# Patient Record
Sex: Female | Born: 1970 | Race: White | Hispanic: No | Marital: Married | Smoking: Former smoker
Health system: Southern US, Community
[De-identification: ages and names within clinical notes are randomized; demographics above are authoritative.]

## PROBLEM LIST (undated history)

## (undated) DIAGNOSIS — E079 Disorder of thyroid, unspecified: Secondary | ICD-10-CM

## (undated) DIAGNOSIS — F419 Anxiety disorder, unspecified: Secondary | ICD-10-CM

## (undated) DIAGNOSIS — K219 Gastro-esophageal reflux disease without esophagitis: Secondary | ICD-10-CM

## (undated) HISTORY — DX: Anxiety disorder, unspecified: F41.9

## (undated) HISTORY — PX: COSMETIC SURGERY: SHX468

## (undated) HISTORY — DX: Gastro-esophageal reflux disease without esophagitis: K21.9

## (undated) HISTORY — PX: EYE SURGERY: SHX253

## (undated) HISTORY — PX: ABDOMINAL HYSTERECTOMY: SHX81

---

## 1999-08-04 ENCOUNTER — Inpatient Hospital Stay (HOSPITAL_COMMUNITY): Admission: AD | Admit: 1999-08-04 | Discharge: 1999-08-04 | Payer: Self-pay | Admitting: Obstetrics and Gynecology

## 1999-08-09 ENCOUNTER — Inpatient Hospital Stay (HOSPITAL_COMMUNITY): Admission: AD | Admit: 1999-08-09 | Discharge: 1999-08-09 | Payer: Self-pay | Admitting: Obstetrics and Gynecology

## 1999-08-14 ENCOUNTER — Encounter (INDEPENDENT_AMBULATORY_CARE_PROVIDER_SITE_OTHER): Payer: Self-pay | Admitting: Specialist

## 1999-08-14 ENCOUNTER — Inpatient Hospital Stay (HOSPITAL_COMMUNITY): Admission: AD | Admit: 1999-08-14 | Discharge: 1999-08-17 | Payer: Self-pay | Admitting: Obstetrics and Gynecology

## 1999-09-16 ENCOUNTER — Other Ambulatory Visit: Admission: RE | Admit: 1999-09-16 | Discharge: 1999-09-16 | Payer: Self-pay | Admitting: Obstetrics and Gynecology

## 2000-09-26 ENCOUNTER — Other Ambulatory Visit: Admission: RE | Admit: 2000-09-26 | Discharge: 2000-09-26 | Payer: Self-pay | Admitting: Obstetrics and Gynecology

## 2000-12-23 ENCOUNTER — Other Ambulatory Visit: Admission: RE | Admit: 2000-12-23 | Discharge: 2000-12-23 | Payer: Self-pay | Admitting: Obstetrics and Gynecology

## 2001-09-07 ENCOUNTER — Other Ambulatory Visit: Admission: RE | Admit: 2001-09-07 | Discharge: 2001-09-07 | Payer: Self-pay | Admitting: Obstetrics and Gynecology

## 2001-12-27 ENCOUNTER — Inpatient Hospital Stay (HOSPITAL_COMMUNITY): Admission: AD | Admit: 2001-12-27 | Discharge: 2001-12-27 | Payer: Self-pay | Admitting: Obstetrics and Gynecology

## 2002-01-02 ENCOUNTER — Ambulatory Visit (HOSPITAL_COMMUNITY): Admission: RE | Admit: 2002-01-02 | Discharge: 2002-01-02 | Payer: Self-pay | Admitting: Obstetrics and Gynecology

## 2002-01-02 ENCOUNTER — Encounter (INDEPENDENT_AMBULATORY_CARE_PROVIDER_SITE_OTHER): Payer: Self-pay | Admitting: Specialist

## 2002-05-10 ENCOUNTER — Encounter: Payer: Self-pay | Admitting: Obstetrics and Gynecology

## 2002-05-10 ENCOUNTER — Encounter: Admission: RE | Admit: 2002-05-10 | Discharge: 2002-05-10 | Payer: Self-pay | Admitting: Obstetrics and Gynecology

## 2002-09-13 ENCOUNTER — Other Ambulatory Visit: Admission: RE | Admit: 2002-09-13 | Discharge: 2002-09-13 | Payer: Self-pay | Admitting: Obstetrics and Gynecology

## 2003-01-31 ENCOUNTER — Other Ambulatory Visit: Admission: RE | Admit: 2003-01-31 | Discharge: 2003-01-31 | Payer: Self-pay | Admitting: Obstetrics and Gynecology

## 2003-03-01 ENCOUNTER — Encounter (INDEPENDENT_AMBULATORY_CARE_PROVIDER_SITE_OTHER): Payer: Self-pay

## 2003-03-01 ENCOUNTER — Ambulatory Visit (HOSPITAL_COMMUNITY): Admission: RE | Admit: 2003-03-01 | Discharge: 2003-03-01 | Payer: Self-pay | Admitting: Obstetrics and Gynecology

## 2003-05-22 ENCOUNTER — Ambulatory Visit (HOSPITAL_COMMUNITY): Admission: RE | Admit: 2003-05-22 | Discharge: 2003-05-22 | Payer: Self-pay | Admitting: Neurology

## 2003-05-31 ENCOUNTER — Ambulatory Visit (HOSPITAL_COMMUNITY): Admission: RE | Admit: 2003-05-31 | Discharge: 2003-05-31 | Payer: Self-pay | Admitting: Neurology

## 2003-05-31 ENCOUNTER — Encounter (INDEPENDENT_AMBULATORY_CARE_PROVIDER_SITE_OTHER): Payer: Self-pay | Admitting: Specialist

## 2003-06-03 ENCOUNTER — Ambulatory Visit (HOSPITAL_COMMUNITY): Admission: RE | Admit: 2003-06-03 | Discharge: 2003-06-03 | Payer: Self-pay | Admitting: Neurology

## 2003-06-25 ENCOUNTER — Other Ambulatory Visit: Admission: RE | Admit: 2003-06-25 | Discharge: 2003-06-25 | Payer: Self-pay | Admitting: Obstetrics and Gynecology

## 2003-08-27 ENCOUNTER — Ambulatory Visit (HOSPITAL_BASED_OUTPATIENT_CLINIC_OR_DEPARTMENT_OTHER): Admission: RE | Admit: 2003-08-27 | Discharge: 2003-08-27 | Payer: Self-pay | Admitting: Plastic Surgery

## 2003-08-27 ENCOUNTER — Ambulatory Visit (HOSPITAL_COMMUNITY): Admission: RE | Admit: 2003-08-27 | Discharge: 2003-08-27 | Payer: Self-pay | Admitting: Plastic Surgery

## 2003-08-27 ENCOUNTER — Encounter (INDEPENDENT_AMBULATORY_CARE_PROVIDER_SITE_OTHER): Payer: Self-pay | Admitting: *Deleted

## 2003-09-13 ENCOUNTER — Other Ambulatory Visit: Admission: RE | Admit: 2003-09-13 | Discharge: 2003-09-13 | Payer: Self-pay | Admitting: Obstetrics and Gynecology

## 2003-12-12 ENCOUNTER — Other Ambulatory Visit: Admission: RE | Admit: 2003-12-12 | Discharge: 2003-12-12 | Payer: Self-pay | Admitting: Obstetrics and Gynecology

## 2004-07-07 ENCOUNTER — Ambulatory Visit (HOSPITAL_COMMUNITY): Admission: RE | Admit: 2004-07-07 | Discharge: 2004-07-07 | Payer: Self-pay | Admitting: Obstetrics and Gynecology

## 2004-07-28 ENCOUNTER — Other Ambulatory Visit: Admission: RE | Admit: 2004-07-28 | Discharge: 2004-07-28 | Payer: Self-pay | Admitting: Obstetrics and Gynecology

## 2004-11-11 ENCOUNTER — Ambulatory Visit (HOSPITAL_COMMUNITY): Admission: RE | Admit: 2004-11-11 | Discharge: 2004-11-11 | Payer: Self-pay | Admitting: Neurology

## 2005-07-22 ENCOUNTER — Emergency Department (HOSPITAL_COMMUNITY): Admission: EM | Admit: 2005-07-22 | Discharge: 2005-07-22 | Payer: Self-pay | Admitting: Family Medicine

## 2005-07-28 ENCOUNTER — Ambulatory Visit (HOSPITAL_COMMUNITY): Admission: RE | Admit: 2005-07-28 | Discharge: 2005-07-28 | Payer: Self-pay | Admitting: Neurology

## 2006-08-16 ENCOUNTER — Encounter: Admission: RE | Admit: 2006-08-16 | Discharge: 2006-08-16 | Payer: Self-pay | Admitting: Orthopedic Surgery

## 2006-08-25 ENCOUNTER — Ambulatory Visit (HOSPITAL_BASED_OUTPATIENT_CLINIC_OR_DEPARTMENT_OTHER): Admission: RE | Admit: 2006-08-25 | Discharge: 2006-08-25 | Payer: Self-pay | Admitting: Orthopedic Surgery

## 2007-04-13 IMAGING — US US ABDOMEN COMPLETE
1 series · 14 of 25 positions shown · non-contrast
Comparison: none

CLINICAL DATA: Right upper quadrant pain. 
 ABDOMINAL ULTRASOUND:
TECHNIQUE: Complete abdominal ultrasound examination was performed including evaluation of the liver, gallbladder, bile ducts, pancreas, kidneys, spleen, IVC, and abdominal aorta.

[Series 1: unknown · 0.27mm/px · 14 of 54 slices shown]
[im 1/54]
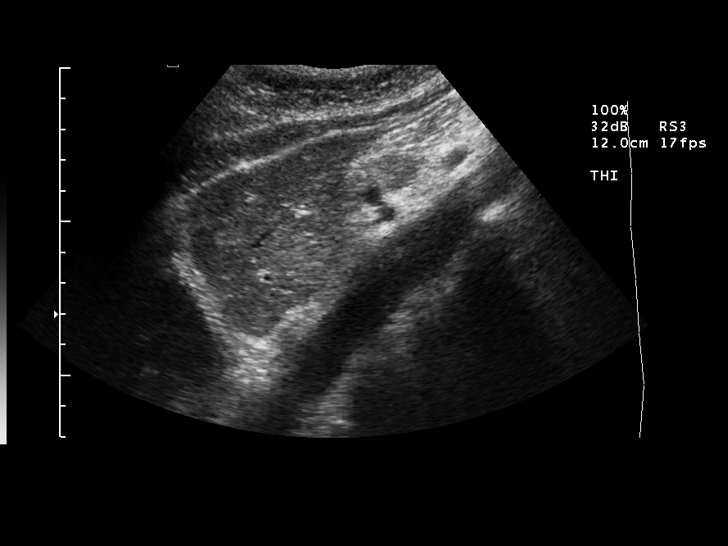
[im 5/54]
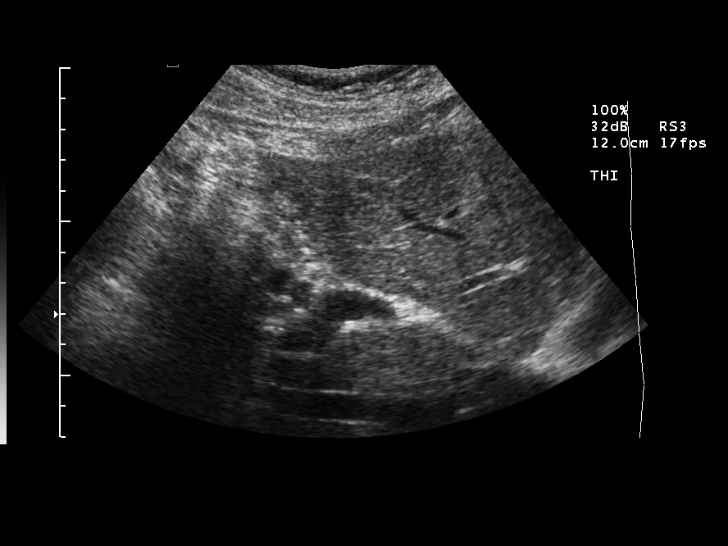
[im 9/54]
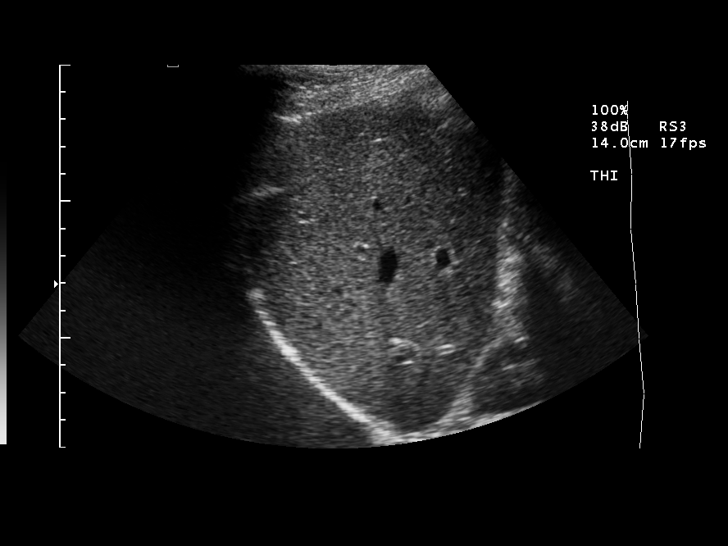
[im 14/54]
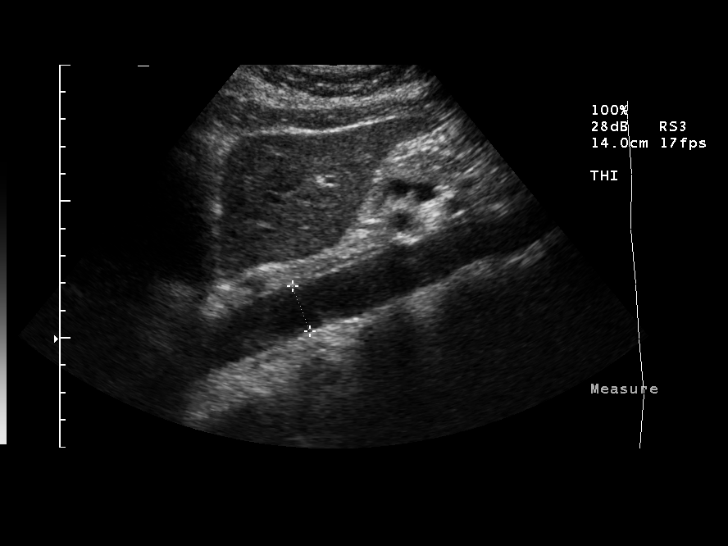
[im 18/54]
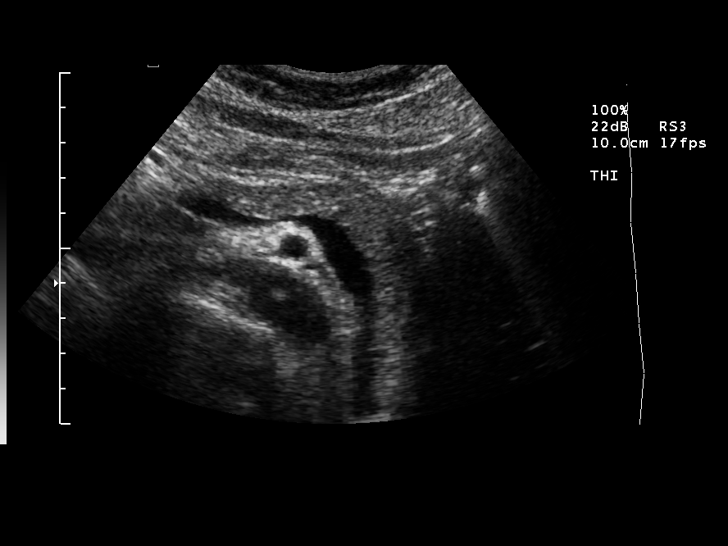
[im 20/54]
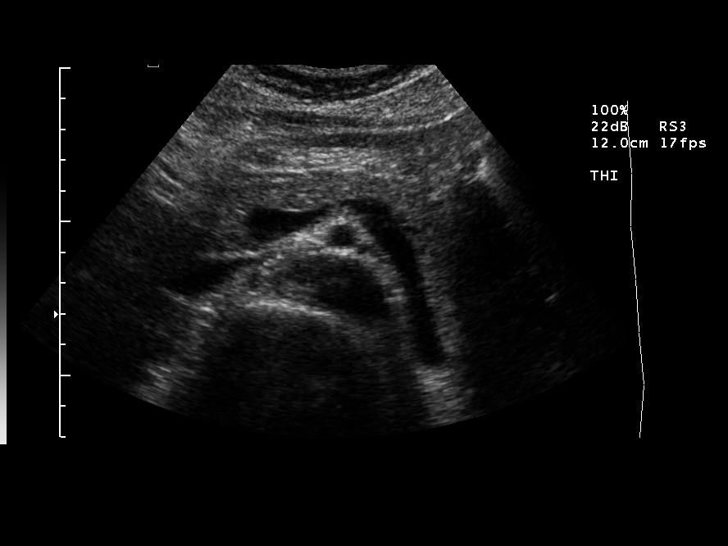
[im 25/54]
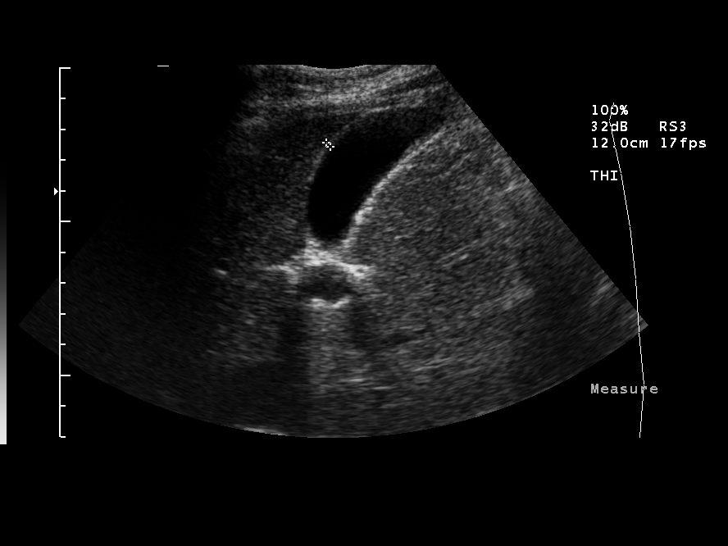
[im 29/54]
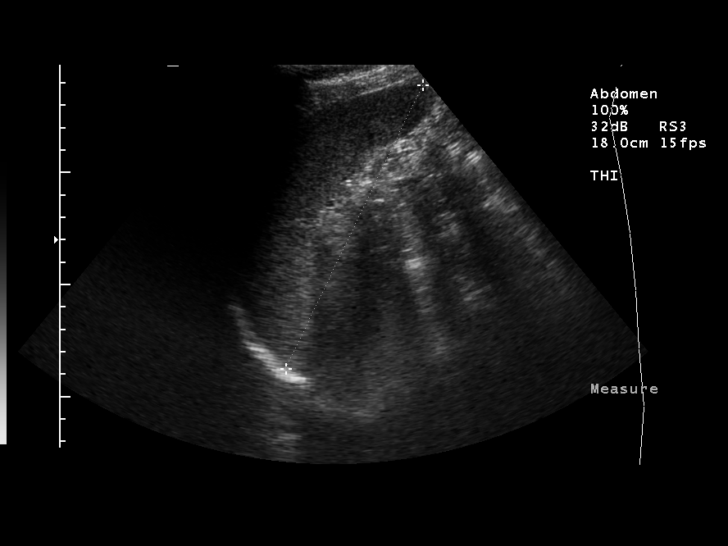
[im 34/54]
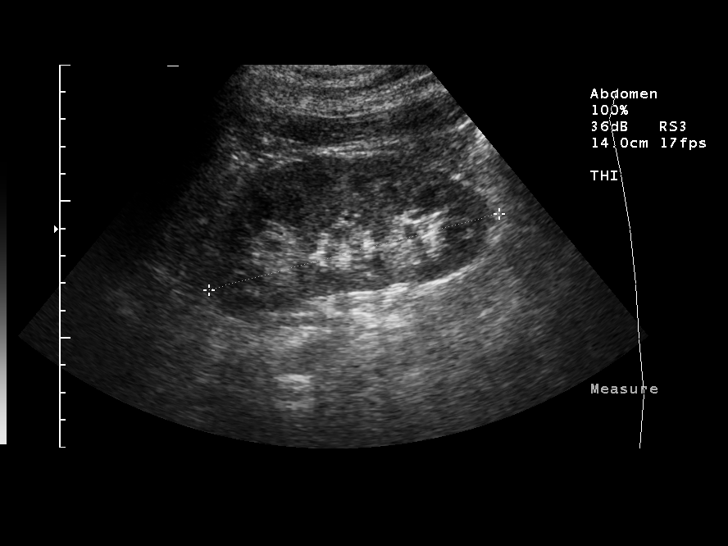
[im 36/54]
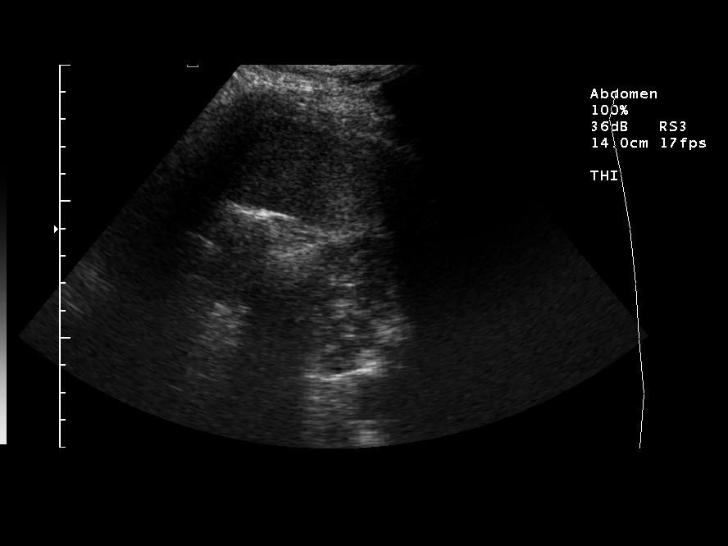
[im 40/54]
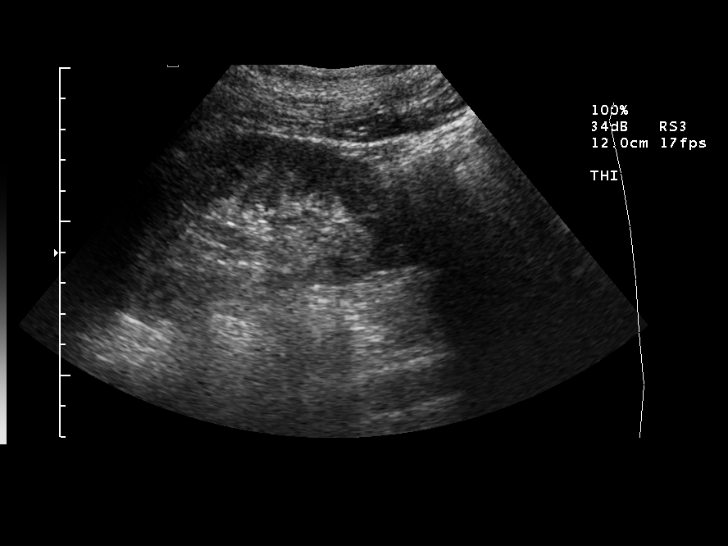
[im 45/54]
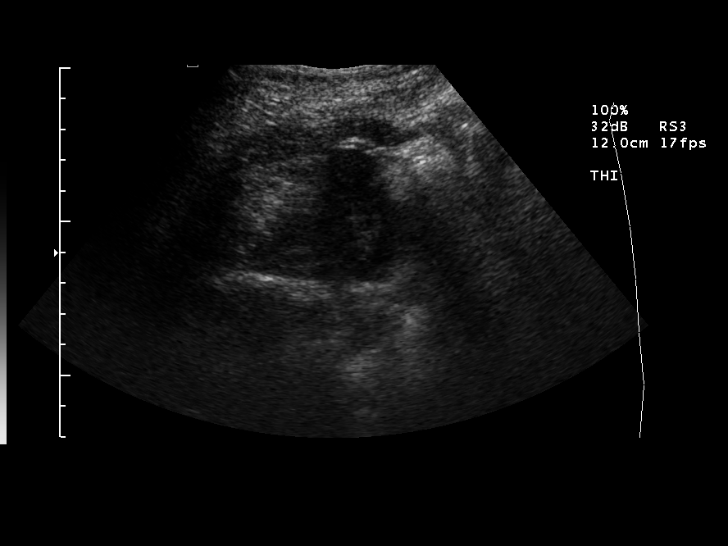
[im 49/54]
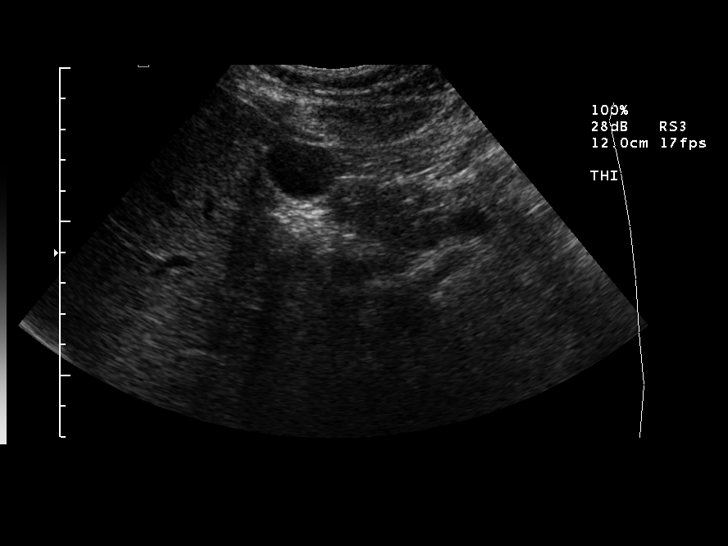
[im 54/54]
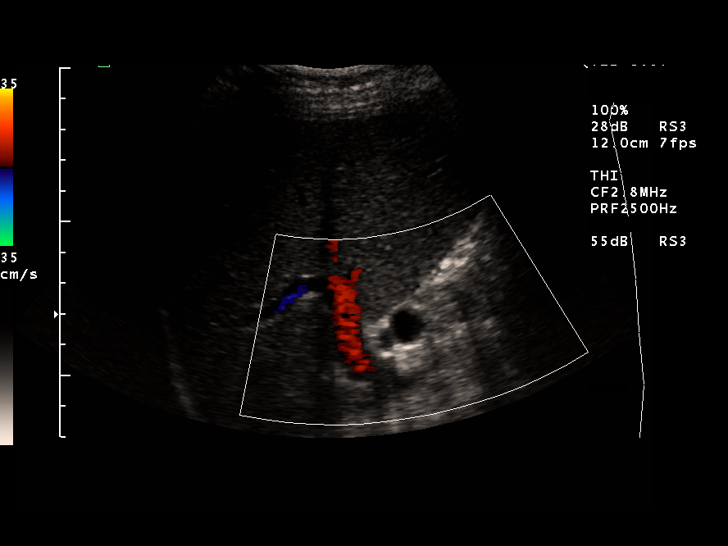

[14 of 25 positions shown; findings below may reference images not displayed]

FINDINGS: The gallbladder is within normal limits without gallstones or wall thickening.  The common bile duct is 3 mm in diameter.  
 The liver is normal in echogenicity without focal mass. The IVC is patent.  Maximal aortic caliber is 1.8 cm. 
 The kidneys are within normal limits without hydronephrosis.  The spleen is mildly enlarged measuring 14 cm in greatest dimension.
 The pancreas is within normal limits.
IMPRESSION: 1.  No gallstones.
 2.  Mild splenomegaly.

## 2008-02-01 ENCOUNTER — Emergency Department (HOSPITAL_COMMUNITY): Admission: EM | Admit: 2008-02-01 | Discharge: 2008-02-01 | Payer: Self-pay | Admitting: Emergency Medicine

## 2008-02-05 ENCOUNTER — Encounter: Admission: RE | Admit: 2008-02-05 | Discharge: 2008-02-05 | Payer: Self-pay | Admitting: Internal Medicine

## 2008-07-17 ENCOUNTER — Ambulatory Visit: Payer: Self-pay | Admitting: Cardiovascular Disease

## 2008-07-17 ENCOUNTER — Encounter: Payer: Self-pay | Admitting: Cardiovascular Disease

## 2008-07-17 DIAGNOSIS — R002 Palpitations: Secondary | ICD-10-CM

## 2008-07-17 DIAGNOSIS — R0602 Shortness of breath: Secondary | ICD-10-CM

## 2008-07-17 DIAGNOSIS — Z9189 Other specified personal risk factors, not elsewhere classified: Secondary | ICD-10-CM

## 2008-07-30 ENCOUNTER — Encounter: Payer: Self-pay | Admitting: Cardiovascular Disease

## 2008-07-30 ENCOUNTER — Ambulatory Visit: Payer: Self-pay

## 2008-07-30 ENCOUNTER — Ambulatory Visit: Payer: Self-pay | Admitting: Cardiovascular Disease

## 2008-08-22 ENCOUNTER — Telehealth: Payer: Self-pay | Admitting: Cardiovascular Disease

## 2008-09-10 DIAGNOSIS — K219 Gastro-esophageal reflux disease without esophagitis: Secondary | ICD-10-CM

## 2008-09-10 DIAGNOSIS — IMO0001 Reserved for inherently not codable concepts without codable children: Secondary | ICD-10-CM | POA: Insufficient documentation

## 2008-09-10 DIAGNOSIS — K5900 Constipation, unspecified: Secondary | ICD-10-CM | POA: Insufficient documentation

## 2008-09-10 DIAGNOSIS — F411 Generalized anxiety disorder: Secondary | ICD-10-CM | POA: Insufficient documentation

## 2008-09-10 DIAGNOSIS — B2790 Infectious mononucleosis, unspecified without complication: Secondary | ICD-10-CM

## 2009-01-31 ENCOUNTER — Encounter: Admission: RE | Admit: 2009-01-31 | Discharge: 2009-01-31 | Payer: Self-pay | Admitting: Gastroenterology

## 2010-06-03 ENCOUNTER — Other Ambulatory Visit: Payer: Self-pay | Admitting: Obstetrics and Gynecology

## 2010-08-25 NOTE — Op Note (Signed)
Karen Haney, HANSELMAN          ACCOUNT NO.:  [id number]   MEDICAL RECORD NO.:  [id number]          PATIENT TYPE:  AMB   LOCATION:  [facility]                         FACILITY:  [facility]   PHYSICIAN:  Marlowe Kays, M.D.  DATE OF BIRTH:  01/13/1971   DATE OF PROCEDURE:  08/25/2006  DATE OF DISCHARGE:                               OPERATIVE REPORT   PREOPERATIVE DIAGNOSIS:  Torn medial meniscus, left knee.   POSTOPERATIVE DIAGNOSIS:  Torn medial meniscus, left knee.   OPERATION:  Left knee arthroscopy with partial medial meniscectomy.   SURGEON:  Marlowe Kays, MD.   ASSISTANT:  Nurse.   ANESTHESIA:  General.   INDICATIONS FOR PROCEDURE:  The patient injured her left knee playing  tennis and had an MRI on 08/22/2006 demonstrating a posterior horn tear  of the medial meniscus.  He reinjured her knee the following evening on  05/13 and has had severe pain and has been unable to bear weight without  crutches.  The MRI demonstrated a posterior horn tear of the medial  meniscus at surgery today.  This was the only noted abnormality with  more extensive tearing as discussed below.   PROCEDURE:  Satisfactory general anesthesia. Ace wrap and knee support  for right leg.  Pneumatic tourniquet with left leg Esmarched out  nonsterilely and thigh stabilizer for the left leg which was prepped  from stabilizer to ankle with DuraPrep and draped in a sterile field.   Superior medial saline inflow.  First, through an anterior lateral  portal in the medial compartment, the knee joint was evaluated.  The  only abnormality was the posterior portion of the medial meniscus.  She  had an extensive tearing at and just prior to the posterior curve, the  radial tear going all the way back to the synovial rim.  She also had a  fairly large intercondylar fragment.  The PCL was intact.  There was  some minimal wear of the medial femoral condyle.  I resected the  posterior curve flap-type tear back to a  stable rim with a combination  of small scissors, up bite small baskets, and shaving down with a 3.5  shaver, the final pictures being taken.  The intercondylar fragment was  difficult to retrieve, requiring constant probing to bring it out from  its retracted position, and I was able to trim it back to a stable rim  with a combination of small up bite basket and shut the basket as well.  The final remnant was stable on probing.  Final pictures were taken  again in the medial gutter and suprapatellar area.  Her patella looked  unremarkable.  There was nothing operable.  I then reversed portals.  The lateral joint looked normal.  The knee joint was then irrigated  until clear and all fluid possible removed.  I closed the 2 anterior  portals with 4-0 nylon.  I then injected 20 mL of 0.5% Marcaine with  adrenalin and 4 mg of morphine through the inflow apparatus which was  removed and this portal closed with 4-0 nylon as well.  Beading and dry  sterile dressing were  applied.  The tourniquet was released.  She  tolerated the procedure well and was taken to the recovery room in  stable condition.  There were no known complications.           ______________________________  Marlowe Kays, M.D.     JA/MEDQ  D:  08/25/2006  T:  08/25/2006  Job:  [id number]

## 2010-08-28 NOTE — H&P (Signed)
   Karen Haney, Karen Haney                      ACCOUNT NO.:  [id number]   MEDICAL RECORD NO.:  [id number]                   PATIENT TYPE:  AMB   LOCATION:  [facility]                                  FACILITY:  [facility]   PHYSICIAN:  Dineen Kid. Rana Snare, M.D.                 DATE OF BIRTH:  [birth year]   DATE OF ADMISSION:  01/02/2002  DATE OF DISCHARGE:                                HISTORY & PHYSICAL   HISTORY OF PRESENT ILLNESS:  The patient is a 40 year old G-3, P-2 at  approximately 10 weeks estimated gestational age who presents for elective  termination of pregnancy.  Her pregnancy has been complicated by severe  hyperemesis.  She has had this for each pregnancy and at this time has  already been to the hospital several times and is unable to function due to  severe hyperemesis.  Estimated date of confinement based on her last  menstrual period is 08/01/02.   PAST MEDICAL HISTORY:  Her past medical history is negative.   PAST SURGICAL HISTORY:  She had a cesarean section in 2001, repair of a  deviated septum at age 46 and she has had a history of liposuction in the  abdomen, hips and legs when she was 24.   MEDICATIONS:  She is currently on Zofran, Phenergan, Synergen, prenatal  vitamins.   ALLERGIES:  No known drug allergies.   PHYSICAL EXAMINATION:  Blood pressure 90/80.  Weight 138.  PELVIC EXAM:  This is a 10-week size.  Cervix is closed, thick and high.   LABORATORY DATA:  Her blood type is B positive.   IMPRESSION AND PLAN:  Intrauterine pregnancy approximately 10 weeks  estimated gestational age complicated by severe hyperemesis.  The patient  and her husband after having lengthy discussions regarding her pregnancy  select they cannot socially, psychologically or financially handle a  pregnancy at this time and request termination of pregnancy.  Plan dilation  and evacuation. Risks and benefits were discussed which include but not  limited to risk of infection, damage to  uterus, tubes, ovaries, bowel or  bladder. Also, discussed long-term contraception. At this time they are  leaning towards and IUD.                                               Dineen Kid Rana Snare, M.D.    DCL/MEDQ  D:  01/02/2002  T:  01/02/2002  Job:  [id number]

## 2010-08-28 NOTE — Discharge Summary (Signed)
[facility]  Patient:    Karen Haney                   MRN: [id number] Adm. Date:  [id number] Disc. Date: [id number] Attending:  Madelyn Flavors Dictator:   Danie Chandler, R.N.                           Discharge Summary  ADMISSION DIAGNOSIS:          Intrauterine pregnancy at term in labor with positive herpes simplex virus outbreak.  DISCHARGE DIAGNOSIS:          Intrauterine pregnancy at term in labor with positive herpes simplex virus outbreak.  PROCEDURES:                   On Aug 14, 1999, primary low cervical transverse cesarean section.  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  Please see the dictated H&P for further details.  HOSPITAL COURSE:              The patient was taken to the operating room and underwent the above-named procedure without complication of a viable female infant with Apgars of 9 at one minute and 9 at five minutes.  Postoperatively, the patient did well.  On postoperative day #1, the patients hemoglobin was 9.5, hematocrit 28.0, and white blood cell count 10.8.  On postoperative day #2, the patient was ambulating without difficulty and had good pain control.  She had good return of bowel function and was tolerating a regular diet.  DISPOSITION:                  She was discharged home on postoperative day #3.  CONDITION ON DISCHARGE:       Good.  DIET:                         Regular as tolerated.  ACTIVITY:                     No heavy lifting.  No driving.  No vaginal entry.  FOLLOW-UP:                    She is to follow up in the office in one to two weeks for incision check.  She is to call for temperature greater than 100 degrees, persistent nausea or vomiting, heavy vaginal bleeding, and/or redness or drainage from the incision site.  DISCHARGE MEDICATIONS:         1. Prenatal vitamins one p.o. q.d.                               2. Pain medications as directed by M.D. DD:  09/02/99 TD:  09/05/99 Job: [id number]

## 2010-08-28 NOTE — Op Note (Signed)
Karen Haney, PALLADINO          ACCOUNT NO.:  [id number]   MEDICAL RECORD NO.:  [id number]          PATIENT TYPE:  AMB   LOCATION:  [facility]                           FACILITY:  [facility]   PHYSICIAN:  Dineen Kid. Rana Snare, M.D.    DATE OF BIRTH:  27-Jun-1970   DATE OF PROCEDURE:  07/07/2004  DATE OF DISCHARGE:                                 OPERATIVE REPORT   PREOPERATIVE DIAGNOSES:  1.  Pelvic pain.  2.  Dyspareunia.  3.  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   POSTOPERATIVE DIAGNOSES:  1.  Pelvic pain.  2.  Dyspareunia.  3.  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   PROCEDURES:  1.  Cervical dilation.  2.  Removal of intrauterine device.   SURGEON:  Dineen Kid. Rana Snare, M.D.   ANESTHESIA:  Paracervical block and general by LMA.   INDICATIONS:  [name] is a 40 year old G3, P2, A1, who presents for  surgical removal of IUD.  She has had pelvic pain and discomfort over the  last several months.  She also has cervical stenosis due to the fact that  she has had a cold knife conization in the past.  She has had abdominal  swelling, bloating, pressure, nausea and urinary frequency.  Ultrasound  shows the IUD has turned in position and there is no string in the cervix.  An attempt at gentle dilation of the cervix in the office was unsuccessful  due to the stenosis and also discomfort.  She presents for definitive  surgical removal of the IUD.  Risks and benefits were discussed, informed  consent was obtained.   DESCRIPTION OF PROCEDURE:  After adequate analgesia, the patient was placed  in the dorsal lithotomy position, she was sterilely prepped and draped, the  bladder was sterilely drained.  A Graves speculum was placed, a tenaculum  was placed on the anterior lip of the cervix.  A paracervical block was  placed with 1% Xylocaine with 1:100,000 epinephrine, 20 mL total used.  A  small endocervical dilator was used to penetrate through the cervical  stenosis, and this was progressively dilated to a #21 Pratt dilator without  complications.  Endocervical polyp forceps were inserted into the uterine  cavity, where the base of the IUD was grasped and delivered atraumatically  through the cervix.  The uterus was sounded to 8 cm.  Minimal bleeding was  encountered.  The IUD string appeared to be intact with the IUD normal in  appearance.  The tenaculum was removed from the cervix.  It was noted to be  hemostatic.  The speculum was then removed and the patient was transferred  to the recovery room in stable condition.  Sponge and instrument count was  normal x3.  Estimated blood loss was minimal.  The patient received 1 g of  Rocephin preoperatively and Toradol 30 mg IV postoperatively.   DISPOSITION:  The patient will be discharged home and will follow up in the  office in two to three weeks, sent home with a routine instruction sheet for  D&C.  Told to return for any increased pain, fever or  bleeding.      DCL/MEDQ  D:  07/07/2004  T:  07/07/2004  Job:  [id number]

## 2010-08-28 NOTE — H&P (Signed)
NAME:  Karen Haney, Karen Haney                    ACCOUNT NO.:  [id number]   MEDICAL RECORD NO.:  [id number]                   PATIENT TYPE:  AMB   LOCATION:  [facility]                                  FACILITY:  [facility]   PHYSICIAN:  Dineen Kid. Rana Snare, M.D.                 DATE OF BIRTH:  03-31-1971   DATE OF ADMISSION:  DATE OF DISCHARGE:                                HISTORY & PHYSICAL   ANTICIPATED DATE OF ADMISSION:  March 01, 2003.   HISTORY OF PRESENT ILLNESS:  Ms. 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  She presents for  cold knife conization for evaluation of high-grade dysplasia of the  endocervix.  She also has a Mirena IUD, doing very well with it, and she  would like to continue with that, so we will plan on removing that at the  time of the cold knife cone and replacing that.  Her Pap smear on September 13, 2002 returned as high-grade dysplasia.  She underwent a colposcopy in July  2004, which was benign.  When contacting pathology they downgraded the Pap  smear to low-grade dysplasia after reexamination approximately three months  later.  In October 2004 she had a repeat Pap smear, which again showed high-  grade dysplasia.  Because of that she underwent a LEEP.  The LEEP returned  as squamocolumnar junction without dysplasia identified.  A second LEEP of  the endocervix showed a rare focus of slight squamous dysplasia and benign  endocervix.   PAST MEDICAL HISTORY:  The past medical history is negative, except for a  history of depression.   PAST SURGICAL HISTORY:  1. Cesarean section.  2. Deviated septum.  3. Liposuction.  4. D&E.   PAST OBSTETRICAL HISTORY:  OB history is significant for a C section and a  vaginal birth.   MEDICATIONS:  None.   ALLERGIES:  The  patient has no known drug allergies.   PHYSICAL EXAMINATION:  VITAL SIGNS:  The patient's blood pressure is 100/62.  HEART:  Regular rate and rhythm.  LUNGS:  Clear to auscultation bilaterally.  ABDOMEN:  Abdomen is nondistended and nontender.  PELVIC EXAMINATION:  The uterus is anteverted, mobile and nontender.  IUD  string is in place.   IMPRESSION AND PLAN:  1. 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  2. Loop electrosurgical excision procedure without identifying the high-     grade dysplasia and a positive endocervical margin.   PLAN:  Cold knife conization for evaluation of the endocervix.   Because of the cold knife conization recommend removal of the IUD before the  procedure.  We will replace it with a new IUD at the completion of the  procedure.  The risks and benefits  of the procedure were discussed at  length, which include, but are not limited to, risks of infection, bleeding,  damage to uterus, tubes, ovaries, bowel, or bladder, possibility that this  dysplasia may no completely be identified or may recur.  She also has given  written and informed consent with regards to the Mirena IUD.                                               Dineen Kid Rana Snare, M.D.    DCL/MEDQ  D:  02/28/2003  T:  02/28/2003  Job:  [id number]

## 2010-08-28 NOTE — Op Note (Signed)
[facility]  Patient:    Karen Haney, Karen Haney                   MRN: [id number] Proc. Date: 08/14/99 Adm. Date:  [id number] Attending:  Madelyn Flavors                           Operative Report  PREOPERATIVE DIAGNOSIS:       Intrauterine pregnancy in labor at term with positive HSV outbreak.  POSTOPERATIVE DIAGNOSIS:      Intrauterine pregnancy in labor at term with positive HSV outbreak.  OPERATION:                    Primary cesarean section low cervical transverse.  SURGEON:                      Beather Arbour. Thomasena Edis, M.D.  ASSISTANT:  ANESTHESIA:                   Spinal.  ESTIMATED BLOOD LOSS:         600 cc.  FLUIDS:                       Approximately 2000 cc of Crystalloid plus 1 gram f Ancef IV.  DRAINS:                       Foley.  COMPLICATIONS:                None.  DESCRIPTION OF PROCEDURE:     The patient is brought to the operating room and identified on the operating table.  After induction of adequate spinal anesthesia, the patient was placed in the left uterine displacement position and prepped and draped in the usual sterile fashion.  The Foley catheter had been previously placed.  A Pfannenstiel incision was made and carried down to the fascia.  The fascia was scored in the midline and extended bilaterally using Mayo scissors. It was then separated free from the underlying muscles.  The muscles were separated in the midline down to the symphysis.  The peritoneum was entered sharply and carefully taking care to avoid bowel or other abdominal contents.  The peritoneal incision was extended superiorly and then inferiorly down to the bladder edge. The bladder blade was placed and the bladder flap was developed.  The uterus was scored in the lower uterine segment and the incision was extended in a curvilinear fashion using bandage scissors.  The amniotic cavity was entered and there was noted to be clear fluid.   All instruments were removed from the operative field.  The fetal  vertex was delivered without difficulty onto the operative field that was noted to be in the occipitotransverse position.  The infant was bulb suctioned and the remainder of the infants body was delivered without difficulty onto the operative field.  The cord was doubly clamped and cut and the baby was handed to the awaiting neonatal resuscitation team.  Cord bloods were collected and cord pH was sent. The placenta was manually removed and sent to pathology for examination due to the history of HSV.  The uterus was exteriorized and wrapped in a wet lap pack. Ring clamps were placed on the uterus and the bladder blade was replaced.  The uterus was closed using two sutures of 0 Monocryl.  The first was a running  interlocking layer of 0 Vicryl and the second was a running imbricating layer of 0 Monocryl.  The uterus, tubes, and ovaries appeared grossly normal.  After noting excellent  uterine hemostasis after placing several figure-of-eight sutures, the uterus was placed back into the abdominal cavity.  Again, excellent uterine incision hemostasis was noted.  Excellent hemostasis was also noted at the bladder flap.  Kelly clamps were placed on the peritoneum and the peritoneum was closed using  simple running suture of 2-0 Monocryl.  The subfascial areas were examined for evidence of hemostasis and excellent hemostasis was achieved using cautery. The muscles were tacked together in the midline using interrupted sutures of 0 Monocryl.  The fascia was closed using a suture of 0 PDS anchored at the leftmost angle of the incision, run to the rightmost angle of the incision, and run in a  simple running fashion.  The subcutaneous tissue was irrigated copiously with warm Ringers lactate after excellent fascial closure was assured.  The subcutaneous tissue was then examined for evidence of hemostasis and excellent  hemostasis was achieved using cautery.  The skin was closed with staples and Steri-Strips were  applied.  The patient tolerated the procedure well without apparent complications and was transferred to the recovery room in stable condition after all sponge, needle, and instrument counts were correct.  The baby was found to be a viable female, Apgars 9 and 9, and went to the normal newborn nursery.  Cord pH was sent and is currently pending. DD:  08/14/99 TD:  08/17/99 Job: [id number]

## 2010-08-28 NOTE — Op Note (Signed)
NAME:  Karen Haney, Karen Haney                    ACCOUNT NO.:  [id number]   MEDICAL RECORD NO.:  [id number]                   PATIENT TYPE:  AMB   LOCATION:  [facility]C                                  FACILITY:  [facility]   PHYSICIAN:  Dineen Kid. Rana Snare, M.D.                 DATE OF BIRTH:  1970/05/15   DATE OF PROCEDURE:  03/01/2003  DATE OF DISCHARGE:                                 OPERATIVE REPORT   PREOPERATIVE DIAGNOSES:  1. 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  2. Intrauterine device with desire to retain intrauterine device function.   POSTOPERATIVE DIAGNOSES:  1. 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  2. Intrauterine device with desire to retain intrauterine device function.   PROCEDURES:  1. Cold knife conization.  2. Intrauterine device removal and reinsertion.   SURGEON:  Dineen Kid. Rana Snare, M.D.   ANESTHESIA:  General by LMA.   INDICATIONS:  Karen Haney is a 40 year old G3, P3, with high-grade  dysplasia status post LEEP procedure, which did not account for the high-  grade dysplasia, had an endocervical positive margin.  She presents today  for a cold knife conization of endocervical high-grade dysplasia.  She also  has a [place] IUD, which is functioning, doing very well for her, that she  would like to retain but understands that with the procedure we would need  to remove it to perform an adequate cold knife conization, so we plan to  remove it and reinsert it.  The risks and benefits of the above procedures  were discussed at length, which include but not limited to risk of  infection, bleeding, damage to uterus, tubes, ovaries, bowel and bladder,  possibility that cervical dysplasia may recur or this may not completely  explain or find the endocervical dysplasia.  She also understands and has  given informed consent for the [place] IUD.   DESCRIPTION OF PROCEDURE:  After adequate analgesia, the patient was placed  in the dorsal lithotomy  position.  She was sterilely prepped and draped.  The bladder was sterilely drained.  A weighted speculum was placed in the  vagina.  A tenaculum was placed on the anterior lip of the cervix and the  cervix was injected with 1% Xylocaine with 1:100,000 epinephrine.  Sturmdorf  sutures were placed with 0 Monocryl, placed at 3 and 9 o'clock.  The  squamocolumnar junction, which had been excised from the LEEP procedure, was  sharply circumscribed with a scalpel.  It was taken down in a conical  fashion toward the endocervical canal.  This was taken approximately 2 cm  deep to the cervix.  After this portion was removed, there was noted that a  small hole posteriorly into the peritoneum as evidenced by epiploica  appendages from the bowel were noted.  Careful examination revealed no  problems with hemostasis.  The edge of the peritoneum was grasped and the  posterior portion of the cervix was  closed with a suture of 0 Monocryl  suture in a figure-of-eight fashion with good approximation and good  hemostasis achieved.  The remaining portion of the anterior cervical canal  was sharply excised and placed with the specimen.  Approximately 1.5-2 cm of  the endocervical canal was excised and included the entire area where the  squamocolumnar junction had been before the LEEP procedure.  At this time  the base of the cone biopsy was Bovie cauterized with hemostasis achieved.  A uterine sound was placed within the endometrial cavity, sounded to 8 cm.  The Farber IUD was inserted according to the manufacturer's recommendations.  The string was cut to one inch in length.  A piece of Gelfoam was placed  within the cervix.  The Sturmdorf sutures were tied across the cervix to  hold the Gelfoam in place.  The tenaculum was removed from the cervix and  noted to be hemostatic.  A small amount of Monsel's was applied.  The  weighted speculum was removed and the patient was transferred to the  recovery room in  stable condition.  Estimated blood loss during the  procedure was less than 20 mL.  The patient was given 1 g of Cefotetan  intraoperatively.   DISPOSITION:  The patient will be discharged home and follow up in the  office in two to three weeks.  She was handed a prescription for Vicodin #30  and a prescription for Keflex 500 mg p.o. t.i.d. x7 days.  Told to return  for any increased pain, fever, or bleeding.                                               Dineen Kid Rana Snare, M.D.    DCL/MEDQ  D:  03/01/2003  T:  03/02/2003  Job:  [id number]

## 2010-08-28 NOTE — Op Note (Signed)
NAMEAUBRYNN, KATONA          ACCOUNT NO.:  [id number]   MEDICAL RECORD NO.:  [id number]          PATIENT TYPE:  AMB   LOCATION:  SDC                           FACILITY:  [facility]   PHYSICIAN:  Dineen Kid. Rana Snare, M.D.    DATE OF BIRTH:  [birth year]   DATE OF PROCEDURE:  DATE OF DISCHARGE:                                 OPERATIVE REPORT   Audio too short to transcribe (less than 5 seconds)      DCL/MEDQ  D:  07/07/2004  T:  07/07/2004  Job:  [id number]

## 2010-08-28 NOTE — Op Note (Signed)
[name], Karen Haney                      ACCOUNT NO.:  [id number]   MEDICAL RECORD NO.:  [id number]                   PATIENT TYPE:  AMB   LOCATION:  SDC                                  [facility]ACILITY:  [facility]   PHYSICIAN:  Dineen Kid. Rana Snare, M.D.                 DATE OF BIRTH:  31-Oct-1970   DATE OF PROCEDURE:  01/02/2002  DATE OF DISCHARGE:                                 OPERATIVE REPORT   PREOPERATIVE DIAGNOSES:  1. Intrauterine pregnancy at 10 weeks.  2. Severe hyperemesis.  3. Desired termination.   POSTOPERATIVE DIAGNOSES:  1. Intrauterine pregnancy at 10 weeks.  2. Severe hyperemesis.  3. Desired termination.   PROCEDURE:  Dilatation and evacuation.   SURGEON:  Dineen Kid. Rana Snare, M.D.   ANESTHESIA:  General endotracheal.   INDICATIONS:  The patient is a 40 year old G3, P2 at 10 weeks with severe  hyperemesis.  She has been on antiemetics and in and out of the hospital for  IV fluids this pregnancy.  She has a history of severe hyperemesis with each  of her other pregnancies.  Both her and her husband desire elective  termination of the pregnancy because of these factors.  Risks and benefits  were discussed at length.  Informed consent was obtained.  See history and  physical for further details.   COMPLICATIONS:  None.   DESCRIPTION OF PROCEDURE:  After adequate analgesia the patient was given  general endotracheal because of her severe hyperemesis.  She was placed in  the dorsal lithotomy position.  She was sterilely prepped and draped.  Graves' speculum was placed.  Tenaculum was placed on the anterior lip of  the cervix.  Uterus was sounded to 12 cm, easily dilated to a number 33  Hegar dilator.  A 9 mm suction curette was inserted.  The products of  conception were retrieved.  This was performed until the uterine cavity had  contracted to approximately 6 weeks size and a gritty surface was felt  throughout the endometrial cavity by palpation and no further products  were  retrieved.  At this time the curette was removed.  The tenaculum was removed  from the cervix.  Hemostasis was achieved with direct pressure.  Speculum  was then removed.  The patient tolerated the procedure well.  Was stable on  transfer to the recovery room.  Sponge, instrument count was normal x3.  Estimated blood loss 50 cc.    DISPOSITION:  The patient will be discharged home.  Will follow up in the  office in three weeks.  She is sent home with a routine instruction sheet  for D&C.  She was given Methergine 0.2 mg in the operating room and Toradol  30 mg IV in the operating room.  She is sent home with a prescription for  Methergine 0.2 mg to take t.i.d. for three days and doxycycline 100 mg p.o.  b.i.d. x7 days.  Dineen Kid Rana Snare, M.D.    DCL/MEDQ  D:  01/02/2002  T:  01/02/2002  Job:  [id number]

## 2010-08-28 NOTE — H&P (Signed)
NAMECARA, Karen Haney          ACCOUNT NO.:  [id number]   MEDICAL RECORD NO.:  [id number]          PATIENT TYPE:  AMB   LOCATION:  SDC                           [facility]ILITY:  [facility]   PHYSICIAN:  Dineen Kid. Rana Snare, M.D.    DATE OF BIRTH:  07-Jul-1970   DATE OF ADMISSION:  07/07/2004  DATE OF DISCHARGE:                                HISTORY & PHYSICAL   HISTORY OF PRESENT ILLNESS:  Karen Haney is a 40 year old, G3, P2, who  presents for surgical removal of an IUD.  The patient has had problems with  pelvic pain and discomfort with the IUD.  She also has cervical stenosis.  She has had abdominal swelling and bloating, pressure, nausea, urinary  frequency.  She has also been having problems with swollen lymph nodes, and  is scheduled to have a biopsy by Dr. Maryagnes Haney.  Pain with intercourse over the  last several months has been getting worse.  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  Since the  placement of the IUD, she has not had menstrual cycles, and has done very  well with the IUD until the last several months, where she has begun having  problems with the pain and the pain with intercourse.  On exam in the office  on June 30, 2004, no IUD string was seen.  The uterus was mildly tender to  deep palpation, and the ultrasound evaluation shows with IUD protruding into  the endometrial cavity and extra rotated at the base of it into the uterine  side wall.  No string was identified in the cervix, and attempt at dilation  of the cervix was unsuccessful due to the cervical stenosis.  She presents  for definitive surgical removal of the IUD.   PHYSICAL EXAMINATION:  PELVIC:  Again, the uterus is anteverted, mobile,  mildly tender to deep palpation.  No adnexal masses are palpable.  No IUD  string is seen in the cervix.  The cervix is stenotic.   IMPRESSION AND PLAN:  1.  Pelvic pain.  2.  Dyspareunia.  3.  Malposition of the IUD with  cervical stenosis.  Unable to dilate her in      the office to remove the IUD.  Have recommended IUD removal due to the      malposition.   PLAN:  Dilation and removal of the IUD in the operating room due to cervical  stenosis.  I discussed the risks and benefits at length with her such as  risk of infection, bleeding, damage to uterus, tubes, ovaries, bowel, or  bladder, the possibility that this may not alleviate the pain.  I did also  start her on Estrostep at this time so that she will have contraceptive  measures after the IUD is removed.  Also also has been having problems with  constipation.  I did begin her on MiraLax for chronic constipation.      DCL/MEDQ  D:  07/06/2004  T:  07/06/2004  Job:  [id number]

## 2010-08-28 NOTE — H&P (Signed)
[facility]  Patient:    Karen Haney, Karen Haney                   MRN: [id number] Adm. Date:  [id number] Attending:  Madelyn Flavors                         History and Physical  HISTORY OF PRESENT ILLNESS:   The patient is a 40 year old, gravida 2, para 1, DC Sep 01, 1999, currently 37+ weeks who presented complaining of active labor. The patient was allowed to walk and her cervix was rechecked.  She was noted to have cervical change from approximately 3 to 4 cm to 5 cm and thus was admitted for  primary cesarean section in active labor.  The patients husband has a history of HSV.  The patient denies any outbreaks herself.  However, called the nurses attention to an area of the right inner labia minora which had been quite pruritic, erythematous, and painful over the last two days.  The patient noted this not only with urination, but continuously throughout the last two days.  Examination of his area revealed an erythematous area with very subtle small ulcers consistent with herpes.  A herpes culture was taken.  It was explained to the patient that this was like a herpes outbreak, but this could maybe not be a herpes outbreak, but still her primary cesarean section is indicated since there is a high index of suspicion that this is a herpes outbreak to decrease the risk of spread to the neonate. he patient expressed understanding and acceptance of this and agreed with the rationale for cesarean section.  Risks of the surgery including anesthetic complications, hemorrhage, infection, damage to adjacent structures including bladder, bowel, blood vessels, or ureters were discussed with the patient.  She was made aware of the unforeseen risks.  She is thus admitted for a primary cesarean section for likely herpes outbreak at term.  Antenatal course has been uncomplicated.  PAST MEDICAL HISTORY:         History of a spontaneous vaginal  delivery of a 9 pound 12 ounce baby.  History of a deviated septum repaired at age 40.  FAMILY HISTORY:               Father with adult onset diabetes mellitus. Mother with colitis.  Also the patient with a history of liposuction in March of 1998.  ALLERGIES:                    No known drug allergies.  MEDICATIONS:                  Prenatal vitamins.  PRENATAL LABORATORY DATA:     One-hour GTT 116 mg per dl.  Blood type B positive, antibody negative, VDRL nonreactive, rubella positive, HBSAG negative.  GC and Chlamydia negative.  PPD negative.  PHYSICAL EXAMINATION:  NECK:                         Supple without thyromegaly.  LUNGS:                        Clear to auscultation.  HEART:                        Regular rate and rhythm.  ABDOMEN:  Gravid and nontender.  PELVIC:                       Cervix 3 to 4 cm per my examination later, 5 cm by the nurse, vertex at -2.  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  PLAN:                         For primary cesarean section for above indications. The risks have been explained to the patient and she expresses understanding and acceptance of these risks. DD:  08/15/99 TD:  08/17/99 Job: [id number]

## 2010-11-12 ENCOUNTER — Other Ambulatory Visit: Payer: Self-pay | Admitting: Internal Medicine

## 2010-11-16 ENCOUNTER — Ambulatory Visit
Admission: RE | Admit: 2010-11-16 | Discharge: 2010-11-16 | Disposition: A | Discharge status: Home / Self Care | Payer: PRIVATE HEALTH INSURANCE | Source: Ambulatory Visit | Attending: Internal Medicine | Admitting: Internal Medicine

## 2011-01-11 LAB — URINE MICROSCOPIC-ADD ON

## 2011-01-11 LAB — URINALYSIS, ROUTINE W REFLEX MICROSCOPIC
Glucose, UA: NEGATIVE
Ketones, ur: NEGATIVE
Leukocytes, UA: NEGATIVE
Nitrite: NEGATIVE
Protein, ur: NEGATIVE
Specific Gravity, Urine: 1.009

## 2011-01-11 LAB — COMPREHENSIVE METABOLIC PANEL
ALT: 15
Albumin: 3.8
CO2: 26
Chloride: 108
Creatinine, Ser: 0.75
Glucose, Bld: 100 — ABNORMAL HIGH
Total Bilirubin: 0.6
Total Protein: 6.2

## 2011-01-11 LAB — CBC
Platelets: 147 — ABNORMAL LOW
RDW: 12.4
WBC: 7.5

## 2014-03-12 ENCOUNTER — Other Ambulatory Visit (HOSPITAL_COMMUNITY): Payer: Self-pay | Admitting: Obstetrics and Gynecology

## 2014-03-12 DIAGNOSIS — R102 Pelvic and perineal pain: Secondary | ICD-10-CM

## 2014-03-14 ENCOUNTER — Ambulatory Visit (HOSPITAL_COMMUNITY)
Admission: RE | Admit: 2014-03-14 | Discharge: 2014-03-14 | Disposition: A | Discharge status: Home / Self Care | Payer: Managed Care, Other (non HMO) | Source: Ambulatory Visit | Attending: Obstetrics and Gynecology | Admitting: Obstetrics and Gynecology

## 2014-03-14 ENCOUNTER — Encounter (HOSPITAL_COMMUNITY): Payer: Self-pay

## 2014-03-14 DIAGNOSIS — Z90710 Acquired absence of both cervix and uterus: Secondary | ICD-10-CM | POA: Insufficient documentation

## 2014-03-14 DIAGNOSIS — N949 Unspecified condition associated with female genital organs and menstrual cycle: Secondary | ICD-10-CM | POA: Diagnosis not present

## 2014-03-14 DIAGNOSIS — R102 Pelvic and perineal pain: Secondary | ICD-10-CM

## 2014-03-14 MED ORDER — IOHEXOL 300 MG/ML  SOLN
100.0000 mL | Freq: Once | INTRAMUSCULAR | Status: AC | PRN
  Administered 2014-03-14: 100 mL via INTRAVENOUS

## 2014-09-19 ENCOUNTER — Other Ambulatory Visit: Payer: Self-pay | Admitting: Obstetrics and Gynecology

## 2014-09-20 LAB — CYTOLOGY - PAP

## 2014-10-03 ENCOUNTER — Other Ambulatory Visit: Payer: Self-pay | Admitting: Obstetrics and Gynecology

## 2014-10-03 DIAGNOSIS — Z803 Family history of malignant neoplasm of breast: Secondary | ICD-10-CM

## 2014-10-15 ENCOUNTER — Ambulatory Visit
Admission: RE | Admit: 2014-10-15 | Discharge: 2014-10-15 | Disposition: A | Discharge status: Home / Self Care | Payer: Managed Care, Other (non HMO) | Source: Ambulatory Visit | Attending: Obstetrics and Gynecology | Admitting: Obstetrics and Gynecology

## 2014-10-15 DIAGNOSIS — Z803 Family history of malignant neoplasm of breast: Secondary | ICD-10-CM

## 2014-10-15 MED ORDER — GADOBENATE DIMEGLUMINE 529 MG/ML IV SOLN
17.0000 mL | Freq: Once | INTRAVENOUS | Status: AC | PRN
  Administered 2014-10-15: 17 mL via INTRAVENOUS

## 2014-12-17 ENCOUNTER — Encounter (HOSPITAL_COMMUNITY): Payer: Self-pay | Admitting: Emergency Medicine

## 2014-12-17 ENCOUNTER — Emergency Department (HOSPITAL_COMMUNITY)
Admission: EM | Admit: 2014-12-17 | Discharge: 2014-12-17 | Disposition: A | Discharge status: Home / Self Care | Payer: Managed Care, Other (non HMO) | Attending: Emergency Medicine | Admitting: Emergency Medicine

## 2014-12-17 ENCOUNTER — Emergency Department (HOSPITAL_COMMUNITY): Payer: Managed Care, Other (non HMO)

## 2014-12-17 DIAGNOSIS — Z3202 Encounter for pregnancy test, result negative: Secondary | ICD-10-CM | POA: Insufficient documentation

## 2014-12-17 DIAGNOSIS — R0602 Shortness of breath: Secondary | ICD-10-CM | POA: Diagnosis not present

## 2014-12-17 DIAGNOSIS — Z79899 Other long term (current) drug therapy: Secondary | ICD-10-CM | POA: Insufficient documentation

## 2014-12-17 DIAGNOSIS — R079 Chest pain, unspecified: Secondary | ICD-10-CM | POA: Diagnosis not present

## 2014-12-17 LAB — I-STAT BETA HCG BLOOD, ED (MC, WL, AP ONLY)

## 2014-12-17 LAB — CBC
HEMATOCRIT: 38.6 % (ref 36.0–46.0)
HEMOGLOBIN: 13.4 g/dL (ref 12.0–15.0)
MCH: 30.2 pg (ref 26.0–34.0)
MCHC: 34.7 g/dL (ref 30.0–36.0)
MCV: 86.9 fL (ref 78.0–100.0)
Platelets: 262 10*3/uL (ref 150–400)
RBC: 4.44 MIL/uL (ref 3.87–5.11)
RDW: 12.1 % (ref 11.5–15.5)
WBC: 7.3 10*3/uL (ref 4.0–10.5)

## 2014-12-17 LAB — BASIC METABOLIC PANEL
ANION GAP: 8 (ref 5–15)
BUN: 15 mg/dL (ref 6–20)
CO2: 21 mmol/L — ABNORMAL LOW (ref 22–32)
Calcium: 9.5 mg/dL (ref 8.9–10.3)
Chloride: 107 mmol/L (ref 101–111)
Creatinine, Ser: 0.95 mg/dL (ref 0.44–1.00)
Glucose, Bld: 83 mg/dL (ref 65–99)
POTASSIUM: 3.6 mmol/L (ref 3.5–5.1)
Sodium: 136 mmol/L (ref 135–145)

## 2014-12-17 LAB — TROPONIN I

## 2014-12-17 LAB — I-STAT TROPONIN, ED: TROPONIN I, POC: 0 ng/mL (ref 0.00–0.08)

## 2014-12-17 LAB — D-DIMER, QUANTITATIVE: D-Dimer, Quant: <0.27 ug/mL-FEU (ref 0.00–0.48)

## 2014-12-17 MED ORDER — ACETAMINOPHEN 500 MG PO TABS
1000.0000 mg | ORAL_TABLET | Freq: Once | ORAL | Status: AC
  Administered 2014-12-17: 1000 mg via ORAL
  Filled 2014-12-17: qty 2

## 2014-12-17 MED ORDER — ONDANSETRON HCL 4 MG/2ML IJ SOLN
4.0000 mg | Freq: Once | INTRAMUSCULAR | Status: AC
  Administered 2014-12-17: 4 mg via INTRAVENOUS
  Filled 2014-12-17: qty 2

## 2014-12-17 NOTE — ED Notes (Signed)
Per EMS:  Pt sent here from PCP after she went to follow up on CP that awoke her from sleep at 4am.  Pt sts it originally felt like heartburn, but has worsened with additional shortness of breath.  Pt feels nauseated, but has not vomited.  Pt received 324 ASA and 1 nitro, with only a small amount of relief.  EMS states EKG was unremarkable.  Pt alert and oriented in hall.

## 2014-12-17 NOTE — ED Notes (Signed)
Pt also states she is feeling nauseous

## 2014-12-17 NOTE — Discharge Instructions (Signed)

## 2014-12-17 NOTE — ED Notes (Signed)
Pt made aware of wait for Troponin lab

## 2014-12-17 NOTE — ED Notes (Signed)
Husband at bedside.  Pt updating him at this time.

## 2014-12-18 NOTE — ED Provider Notes (Signed)
CSN: [id number]     Arrival date & time 12/17/14  1538 History   First MD Initiated Contact with Patient 12/17/14 1539     Chief Complaint  Patient presents with  . Chest Pain  . Shortness of Breath     (Consider location/radiation/quality/duration/timing/severity/associated sxs/prior Treatment) HPI Comments: 44yo F w/ hx of hypothyroidism, ADHD, anxiety, and hyperlipidemia who p/w chest pain. Patient states that at 4am she woke up with central, non-radiating chest pain that is a tightness which intermittently becomes severe. It is associated with SOB, nausea, and diaphoresis. She has had a mild cough today but no recent fevers, cold symptoms, vomiting, diarrhea, or illness. No sudden ripping or tearing chest pain radiating to back. She went to PCP where she received 1 NTG,  ASA, and EKG. She was sent here for further eval. NTG mildly help but symptoms still present. She has had panic attacks previously but none with symptoms lasting this long. No recent travel, hx of cancer, or OCP use. No personal or FH of unprovoked blood clots. No tobacco use.  FH negative for cardiac disease.   Patient is a 44 y.o. female presenting with chest pain and shortness of breath. The history is provided by the patient.  Chest Pain Associated symptoms: shortness of breath   Shortness of Breath Associated symptoms: chest pain     History reviewed. No pertinent past medical history. History reviewed. No pertinent past surgical history. History reviewed. No pertinent family history. Social History  Substance Use Topics  . Smoking status: Never Smoker   . Smokeless tobacco: Never Used  . Alcohol Use: Yes     Comment: occasionally   OB History    No data available     Review of Systems  Respiratory: Positive for shortness of breath.   Cardiovascular: Positive for chest pain.    10 Systems reviewed and are negative for acute change except as noted in the HPI.   Allergies  Review of patient's  allergies indicates no known allergies.  Home Medications   Prior to Admission medications   Medication Sig Start Date End Date Taking? Authorizing Provider  amphetamine-dextroamphetamine (ADDERALL) 20 MG tablet Take 20 mg by mouth daily. 11/25/14  Yes Historical Provider, MD  escitalopram (LEXAPRO) 10 MG tablet Take 10 mg by mouth daily. 11/18/14  Yes Historical Provider, MD  fenofibrate 160 MG tablet Take 160 mg by mouth daily. 10/22/14  Yes Historical Provider, MD  ibuprofen (ADVIL,MOTRIN) 200 MG tablet Take 200 mg by mouth every 6 (six) hours as needed for headache, mild pain or moderate pain.   Yes Historical Provider, MD  levothyroxine (SYNTHROID, LEVOTHROID) 50 MCG tablet Take 50 mcg by mouth at bedtime. 09/23/14  Yes Historical Provider, MD  oxymetazoline (AFRIN) 0.05 % nasal spray Place 1 spray into both nostrils at bedtime.   Yes Historical Provider, MD  valACYclovir (VALTREX) 1000 MG tablet Take 1 g by mouth 2 (two) times daily as needed (lip injections).  10/28/14  Yes Historical Provider, MD  zolpidem (AMBIEN) 10 MG tablet Take 10 mg by mouth at bedtime as needed for sleep.  11/20/14  Yes Historical Provider, MD   BP 123/74 mmHg  Pulse 71  Temp(Src) 97.7 F (36.5 C) (Oral)  Resp 18  SpO2 100% Physical Exam  Constitutional: She is oriented to person, place, and time. She appears well-developed and well-nourished.  Tearful, uncomfortable but NAD  HENT:  Head: Normocephalic and atraumatic.  Moist mucous membranes  Eyes: Conjunctivae are normal. Pupils are  equal, round, and reactive to light.  Neck: Neck supple.  Cardiovascular: Normal rate, regular rhythm and normal heart sounds.   No murmur heard. Pulmonary/Chest: Effort normal and breath sounds normal.  Abdominal: Soft. Bowel sounds are normal. She exhibits no distension. There is no tenderness.  Musculoskeletal: She exhibits no edema.  Neurological: She is alert and oriented to person, place, and time.  Fluent speech  Skin:  Skin is warm and dry.  Psychiatric: Judgment normal.  Anxious, tearful  Nursing note and vitals reviewed.   ED Course  Procedures (including critical care time) Labs Review Labs Reviewed  BASIC METABOLIC PANEL - Abnormal; Notable for the following:    CO2 21 (*)    All other components within normal limits  CBC  D-DIMER, QUANTITATIVE (NOT AT [facility])  TROPONIN I  I-STAT BETA HCG BLOOD, ED ([facility], [facility], [facility] ONLY)  I-STAT TROPOININ, ED    Imaging Review Dg Chest 2 View  12/17/2014   CLINICAL DATA:  Chest pain shortness of breath since this morning radiating into left shoulder and neck  EXAM: CHEST  2 VIEW  COMPARISON:  11/12/2010  FINDINGS: The heart size and mediastinal contours are within normal limits. Both lungs are clear. The visualized skeletal structures are unremarkable.  IMPRESSION: No active cardiopulmonary disease.   Electronically Signed   By: Esperanza Heir M.D.   On: 12/17/2014 16:26   I have personally reviewed and evaluated these images and lab results as part of my medical decision-making.   EKG Interpretation None     Medications  ondansetron (ZOFRAN) injection 4 mg (4 mg Intravenous Given 12/17/14 1722)  acetaminophen (TYLENOL) tablet 1,000 mg (1,000 mg Oral Given 12/17/14 1722)    MDM   Final diagnoses:  Chest pain, unspecified chest pain type   44yo F who p/w CP and SOB that began earlier today. She was sent from PCP office after receiving NTG and aspirin. Patient anxious and uncomfortable at presentation. VS unremarkable. EKG on arrival without evidence of ST elevation. Obtained above labs including serial troponins, D dimer, and basic labwork.  Labs unremarkable. On-reexamination, patient is calm and appears more comfortable. No tachycardia to suggest thyrotoxicosis.   No recent URI to suggest pericarditis. Low risk for PE and D dimer negative, thus PE very unlikely. CXR shows no widened mediastinum and no ripping/tearing CP radiating to back to suggest aortic  dissection. HEART score is <3 thus patient's risk of ACS low. I have instructed her on supportive care and importance of close PCP f/u. Return precautions reviewed and pt discharged in satisfactory condition.  Laurence Spates, MD 12/18/14 [id number]

## 2015-08-19 ENCOUNTER — Other Ambulatory Visit: Payer: Self-pay | Admitting: Sports Medicine

## 2015-08-19 DIAGNOSIS — M5412 Radiculopathy, cervical region: Secondary | ICD-10-CM

## 2015-08-20 ENCOUNTER — Ambulatory Visit
Admission: RE | Admit: 2015-08-20 | Discharge: 2015-08-20 | Disposition: A | Discharge status: Home / Self Care | Payer: Managed Care, Other (non HMO) | Source: Ambulatory Visit | Attending: Sports Medicine | Admitting: Sports Medicine

## 2015-08-20 DIAGNOSIS — M5412 Radiculopathy, cervical region: Secondary | ICD-10-CM

## 2016-03-09 ENCOUNTER — Emergency Department (HOSPITAL_BASED_OUTPATIENT_CLINIC_OR_DEPARTMENT_OTHER)
Admission: EM | Admit: 2016-03-09 | Discharge: 2016-03-09 | Disposition: A | Discharge status: Home / Self Care | Payer: BLUE CROSS/BLUE SHIELD | Attending: Emergency Medicine | Admitting: Emergency Medicine

## 2016-03-09 ENCOUNTER — Encounter (HOSPITAL_BASED_OUTPATIENT_CLINIC_OR_DEPARTMENT_OTHER): Payer: Self-pay | Admitting: *Deleted

## 2016-03-09 DIAGNOSIS — Z79899 Other long term (current) drug therapy: Secondary | ICD-10-CM | POA: Insufficient documentation

## 2016-03-09 DIAGNOSIS — M549 Dorsalgia, unspecified: Secondary | ICD-10-CM

## 2016-03-09 DIAGNOSIS — M546 Pain in thoracic spine: Secondary | ICD-10-CM | POA: Diagnosis not present

## 2016-03-09 DIAGNOSIS — M25512 Pain in left shoulder: Secondary | ICD-10-CM | POA: Diagnosis present

## 2016-03-09 HISTORY — DX: Disorder of thyroid, unspecified: E07.9

## 2016-03-09 MED ORDER — OXYCODONE-ACETAMINOPHEN 5-325 MG PO TABS
2.0000 | ORAL_TABLET | Freq: Once | ORAL | Status: AC
  Administered 2016-03-09: 2 via ORAL
  Filled 2016-03-09: qty 2

## 2016-03-09 MED ORDER — OXYCODONE-ACETAMINOPHEN 5-325 MG PO TABS: 1.0000 | ORAL_TABLET | ORAL | 0 refills | Status: DC | PRN

## 2016-03-09 MED ORDER — METHOCARBAMOL 500 MG PO TABS: 500.0000 mg | ORAL_TABLET | Freq: Three times a day (TID) | ORAL | 0 refills | Status: DC | PRN

## 2016-03-09 MED ORDER — KETOROLAC TROMETHAMINE 60 MG/2ML IM SOLN
60.0000 mg | Freq: Once | INTRAMUSCULAR | Status: AC
  Administered 2016-03-09: 60 mg via INTRAMUSCULAR
  Filled 2016-03-09: qty 2

## 2016-03-09 MED ORDER — METHOCARBAMOL 500 MG PO TABS
1000.0000 mg | ORAL_TABLET | Freq: Once | ORAL | Status: AC
  Administered 2016-03-09: 1000 mg via ORAL
  Filled 2016-03-09: qty 2

## 2016-03-09 MED FILL — OXYCODONE/APAP 5/325 MG TAB: 5-325 | 2 days supply | Qty: 12 | Fill #0

## 2016-03-09 MED FILL — METHOCARBAMOL 500 MG TABLET: 500 | 4 days supply | Qty: 12 | Fill #0

## 2016-03-09 NOTE — ED Triage Notes (Addendum)
Patient c/o left shoulder burning pain that shoots dopwn her let arm for the past two weeks and the past few days radiates to her chest. She was given gabapentin and hydrocodone which provided no relief and is now taking tramadol, but no relief. She was referred to a neurologist and is scheduled to see them in February.

## 2016-03-09 NOTE — ED Provider Notes (Signed)
[facility] Provider Note   CSN: [id number] Arrival date & time: 03/09/16  0704     History   Chief Complaint Chief Complaint  Patient presents with  . Shoulder Pain    HPI Karen FlamingChristine M Cumby is a 45 y.o. female.  HPI Patient reports burning pain that shoots from her neck down her left arm over the past 2 weeks.  Recently the pain is been been radiating around to her left chest.  She was given gabapentin and hydrocodone which she reports is given her no relief.  She has also tried tramadol without improvement.  She is being referred to a neurologist for ongoing discomfort and pain.  She had MRI of her cervical spine in May for similar symptoms at that time which demonstrated no abnormality.  She states that her pain is moderate to severe in severity and is burning and aching.  She denies weakness of her left upper arm.    Current Provider: dr Penni BombardKendall ([facility] - [facility] medicine)   Past Medical History:  Diagnosis Date  . Thyroid disease     Patient Active Problem List   Diagnosis Date Noted  . MONONUCLEOSIS 09/10/2008  . ANXIETY 09/10/2008  . GERD 09/10/2008  . CONSTIPATION 09/10/2008  . FIBROMYALGIA 09/10/2008  . PALPITATIONS, RECURRENT 07/17/2008  . DYSPNEA 07/17/2008  . CHEST PAIN, ATYPICAL, HX OF 07/17/2008    No past surgical history on file.  OB History    No data available       Home Medications    Prior to Admission medications   Medication Sig Start Date End Date Taking? Authorizing Provider  GABAPENTIN PO Take by mouth.   Yes Historical Provider, MD  TRAMADOL HCL ER PO Take by mouth.   Yes Historical Provider, MD  amphetamine-dextroamphetamine (ADDERALL) 20 MG tablet Take 20 mg by mouth daily. 11/25/14   Historical Provider, MD  escitalopram (LEXAPRO) 10 MG tablet Take 10 mg by mouth daily. 11/18/14   Historical Provider, MD  fenofibrate 160 MG tablet Take 160 mg by mouth daily. 10/22/14   Historical Provider, MD  ibuprofen  (ADVIL,MOTRIN) 200 MG tablet Take 200 mg by mouth every 6 (six) hours as needed for headache, mild pain or moderate pain.    Historical Provider, MD  levothyroxine (SYNTHROID, LEVOTHROID) 50 MCG tablet Take 50 mcg by mouth at bedtime. 09/23/14   Historical Provider, MD  methocarbamol (ROBAXIN) 500 MG tablet Take 1 tablet (500 mg total) by mouth every 8 (eight) hours as needed for muscle spasms. 03/09/16   Azalia BilisKevin Shrika Milos, MD  oxyCODONE-acetaminophen (PERCOCET/ROXICET) 5-325 MG tablet Take 1 tablet by mouth every 4 (four) hours as needed for severe pain. 03/09/16   Azalia BilisKevin Patrick Salemi, MD  oxymetazoline (AFRIN) 0.05 % nasal spray Place 1 spray into both nostrils at bedtime.    Historical Provider, MD  valACYclovir (VALTREX) 1000 MG tablet Take 1 g by mouth 2 (two) times daily as needed (lip injections).  10/28/14   Historical Provider, MD  zolpidem (AMBIEN) 10 MG tablet Take 10 mg by mouth at bedtime as needed for sleep.  11/20/14   Historical Provider, MD    Family History No family history on file.  Social History Social History  Substance Use Topics  . Smoking status: Never Smoker  . Smokeless tobacco: Never Used  . Alcohol use Yes     Comment: occasionally     Allergies   Patient has no known allergies.   Review of Systems Review of Systems  All  other systems reviewed and are negative.    Physical Exam Updated Vital Signs BP (!) 163/102 (BP Location: Left Arm)   Pulse 105   Temp 97.8 F (36.6 C) (Oral)   Resp 20   Ht 5\' 6"  (1.676 m)   Wt 190 lb (86.2 kg)   SpO2 98%   BMI 30.67 kg/m   Physical Exam  Constitutional: She is oriented to person, place, and time. She appears well-developed and well-nourished.  HENT:  Head: Normocephalic.  Eyes: EOM are normal.  Neck: Normal range of motion.  Pulmonary/Chest: Effort normal.  Abdominal: She exhibits no distension.  Musculoskeletal:  Full range of motion of left shoulder, left elbow, left wrist.  Normal left radial pulse.  Normal  grip strength left hand.  No swelling of the left upper extremity as compared to the right.  No cervical or thoracic tenderness.  Mild tenderness of the left paracervical and left parathoracic regions  Neurological: She is alert and oriented to person, place, and time.  Psychiatric: She has a normal mood and affect.  Nursing note and vitals reviewed.    ED Treatments / Results  Labs (all labs ordered are listed, but only abnormal results are displayed) Labs Reviewed - No data to display  EKG  EKG Interpretation None       Radiology No results found.  Procedures Procedures (including critical care time)  Medications Ordered in ED Medications  oxyCODONE-acetaminophen (PERCOCET/ROXICET) 5-325 MG per tablet 2 tablet (2 tablets Oral Given 03/09/16 0747)  ketorolac (TORADOL) injection 60 mg (60 mg Intramuscular Given 03/09/16 0747)  methocarbamol (ROBAXIN) tablet 1,000 mg (1,000 mg Oral Given 03/09/16 0747)     Initial Impression / Assessment and Plan / ED Course  I have reviewed the triage vital signs and the nursing notes.  Pertinent labs & imaging results that were available during my care of the patient were reviewed by me and considered in my medical decision making (see chart for details).  Clinical Course     No weakness.  No indication for additional imaging at this time.  Patient be placed on a short course of oxycodone and muscle relaxants.  She will need follow-up with sports medicine and neurology.  I provided her some additional neurology numbers to try and get a sooner appointment.  I have also referred her for a second opinion to Dr. Pearletha ForgeHudnall, sports medicine at [facility].   Final Clinical Impressions(s) / ED Diagnoses   Final diagnoses:  Acute upper back pain    New Prescriptions New Prescriptions   METHOCARBAMOL (ROBAXIN) 500 MG TABLET    Take 1 tablet (500 mg total) by mouth every 8 (eight) hours as needed for muscle spasms.    OXYCODONE-ACETAMINOPHEN (PERCOCET/ROXICET) 5-325 MG TABLET    Take 1 tablet by mouth every 4 (four) hours as needed for severe pain.     Azalia BilisKevin Faithlyn Recktenwald, MD 03/09/16 [phone]

## 2016-04-02 ENCOUNTER — Encounter (HOSPITAL_COMMUNITY): Payer: Self-pay

## 2016-04-02 ENCOUNTER — Emergency Department (HOSPITAL_COMMUNITY): Payer: BLUE CROSS/BLUE SHIELD

## 2016-04-02 ENCOUNTER — Emergency Department (HOSPITAL_COMMUNITY)
Admission: EM | Admit: 2016-04-02 | Discharge: 2016-04-03 | Disposition: A | Discharge status: Home / Self Care | Payer: BLUE CROSS/BLUE SHIELD | Attending: Emergency Medicine | Admitting: Emergency Medicine

## 2016-04-02 DIAGNOSIS — F444 Conversion disorder with motor symptom or deficit: Secondary | ICD-10-CM | POA: Insufficient documentation

## 2016-04-02 DIAGNOSIS — H81399 Other peripheral vertigo, unspecified ear: Secondary | ICD-10-CM | POA: Insufficient documentation

## 2016-04-02 DIAGNOSIS — R42 Dizziness and giddiness: Secondary | ICD-10-CM | POA: Diagnosis present

## 2016-04-02 DIAGNOSIS — Z79899 Other long term (current) drug therapy: Secondary | ICD-10-CM | POA: Insufficient documentation

## 2016-04-02 DIAGNOSIS — F449 Dissociative and conversion disorder, unspecified: Secondary | ICD-10-CM

## 2016-04-02 LAB — CBC
HEMATOCRIT: 42.4 % (ref 36.0–46.0)
Hemoglobin: 14.8 g/dL (ref 12.0–15.0)
MCH: 30.6 pg (ref 26.0–34.0)
MCHC: 34.9 g/dL (ref 30.0–36.0)
MCV: 87.6 fL (ref 78.0–100.0)
Platelets: 197 10*3/uL (ref 150–400)
RBC: 4.84 MIL/uL (ref 3.87–5.11)
RDW: 12.7 % (ref 11.5–15.5)
WBC: 6.8 10*3/uL (ref 4.0–10.5)

## 2016-04-02 LAB — URINALYSIS, ROUTINE W REFLEX MICROSCOPIC
BILIRUBIN URINE: NEGATIVE
Glucose, UA: NEGATIVE mg/dL
Hgb urine dipstick: NEGATIVE
KETONES UR: NEGATIVE mg/dL
Leukocytes, UA: NEGATIVE
NITRITE: NEGATIVE
PH: 5 (ref 5.0–8.0)
Protein, ur: NEGATIVE mg/dL
Specific Gravity, Urine: 1.041 — ABNORMAL HIGH (ref 1.005–1.030)

## 2016-04-02 LAB — BASIC METABOLIC PANEL
Anion gap: 8 (ref 5–15)
BUN: 11 mg/dL (ref 6–20)
CALCIUM: 9.5 mg/dL (ref 8.9–10.3)
CO2: 20 mmol/L — ABNORMAL LOW (ref 22–32)
Chloride: 111 mmol/L (ref 101–111)
Creatinine, Ser: 0.97 mg/dL (ref 0.44–1.00)
GFR calc Af Amer: >60 mL/min (ref >60)
GLUCOSE: 113 mg/dL — AB (ref 65–99)
POTASSIUM: 3.8 mmol/L (ref 3.5–5.1)
Sodium: 139 mmol/L (ref 135–145)

## 2016-04-02 MED ORDER — MECLIZINE HCL 25 MG PO TABS
25.0000 mg | ORAL_TABLET | Freq: Once | ORAL | Status: DC
Start: 2016-04-02 — End: 2016-04-02

## 2016-04-02 MED ORDER — IBUPROFEN 400 MG PO TABS
600.0000 mg | ORAL_TABLET | Freq: Once | ORAL | Status: AC
  Administered 2016-04-02: 600 mg via ORAL
  Filled 2016-04-02: qty 1

## 2016-04-02 MED ORDER — IOPAMIDOL (ISOVUE-370) INJECTION 76%
INTRAVENOUS | Status: AC
  Administered 2016-04-02: 50 mL
  Filled 2016-04-02: qty 50

## 2016-04-02 MED ORDER — PROMETHAZINE HCL 25 MG/ML IJ SOLN
25.0000 mg | Freq: Once | INTRAMUSCULAR | Status: AC
  Administered 2016-04-02: 25 mg via INTRAVENOUS
  Filled 2016-04-02: qty 1

## 2016-04-02 MED ORDER — MECLIZINE HCL 25 MG PO TABS
25.0000 mg | ORAL_TABLET | Freq: Once | ORAL | Status: AC
  Administered 2016-04-02: 25 mg via ORAL
  Filled 2016-04-02: qty 1

## 2016-04-02 MED ORDER — GADOBENATE DIMEGLUMINE 529 MG/ML IV SOLN
20.0000 mL | Freq: Once | INTRAVENOUS | Status: AC
  Administered 2016-04-02: 18 mL via INTRAVENOUS

## 2016-04-02 MED ORDER — ONDANSETRON 4 MG PO TBDP
ORAL_TABLET | ORAL | Status: AC
  Filled 2016-04-02: qty 1

## 2016-04-02 MED ORDER — DIAZEPAM 2 MG PO TABS: 1.0000 mg | ORAL_TABLET | Freq: Once | ORAL | Status: DC

## 2016-04-02 MED ORDER — ONDANSETRON 4 MG PO TBDP
4.0000 mg | ORAL_TABLET | Freq: Once | ORAL | Status: AC | PRN
  Administered 2016-04-02: 4 mg via ORAL

## 2016-04-02 NOTE — ED Notes (Signed)
Pt to MR via stretcher.

## 2016-04-02 NOTE — ED Notes (Signed)
Pt transported to CT ?

## 2016-04-02 NOTE — ED Notes (Signed)
Pt returned from MRI °

## 2016-04-02 NOTE — ED Notes (Signed)
Pt ambulatory to BR, 2 person assist. Pt continues be very dizzy but would like to go home after phenergan. Pt feels nothing has changed with treatments.

## 2016-04-02 NOTE — ED Triage Notes (Signed)
Pt presents for evaluation of dizziness starting yesterday AM around 0800. Pt. States felt normal initially when she woke up but quickly began having profound dizziness even at rest. States was recently evaluated for numbness to L arm/fingers/hand, no new changes to arm. Pt. States has had intermittent nausea. Pt AxO x4.

## 2016-04-02 NOTE — ED Provider Notes (Signed)
[facility]-EMER[facility]ENCY DEPT Provider Note   CSN: [id number] Arrival date & time: 04/02/16  1133     History   Chief Complaint Chief Complaint  Patient presents with  . Dizziness    HPI Karen Haney is a 45 y.o. female.  The history is provided by the patient.  Dizziness  Quality:  Room spinning and vertigo Severity:  Moderate Onset quality:  Sudden Duration:  2 days Timing:  Intermittent Progression:  Waxing and waning Chronicity:  New Context: bending over, head movement and standing up   Context: not with eye movement and not with loss of consciousness   Relieved by:  Being still Worsened by:  Turning head, standing up and lying down Associated symptoms: nausea   Associated symptoms: no chest pain, no headaches, no palpitations, no shortness of breath, no syncope, no tinnitus, no vision changes, no vomiting and no weakness     Past Medical History:  Diagnosis Date  . Thyroid disease     Patient Active Problem List   Diagnosis Date Noted  . MONONUCLEOSIS 09/10/2008  . ANXIETY 09/10/2008  . GERD 09/10/2008  . CONSTIPATION 09/10/2008  . FIBROMYALGIA 09/10/2008  . PALPITATIONS, RECURRENT 07/17/2008  . DYSPNEA 07/17/2008  . CHEST PAIN, ATYPICAL, HX OF 07/17/2008    History reviewed. No pertinent surgical history.  OB History    No data available       Home Medications    Prior to Admission medications   Medication Sig Start Date End Date Taking? Authorizing Provider  BIOTIN PO Take 1 tablet by mouth daily.   Yes Historical Provider, MD  Cholecalciferol (VITAMIN D3 PO) Take 1 tablet by mouth 2 (two) times daily.   Yes Historical Provider, MD  [employer] FIBER GUMMIES PO Take 1 each by mouth daily.   Yes Historical Provider, MD  MAGNESIUM PO Take 1 tablet by mouth at bedtime.   Yes Historical Provider, MD  omeprazole (PRILOSEC) 20 MG capsule Take 20 mg by mouth daily. 03/02/16  Yes Historical Provider, MD  progesterone (PROMETRIUM) 100 MG capsule Take 100 mg  by mouth at bedtime.   Yes Historical Provider, MD  thyroid (ARMOUR) 90 MG tablet Take 90 mg by mouth daily.   Yes Historical Provider, MD  zolpidem (AMBIEN) 10 MG tablet Take 10 mg by mouth at bedtime.  11/20/14  Yes Historical Provider, MD  meclizine (ANTIVERT) 25 MG tablet Take 1 tablet (25 mg total) by mouth 3 (three) times daily as needed for dizziness. 04/03/16 04/10/16  Dwana Melenaobin Evann Koelzer, DO  promethazine (PHENERGAN) 25 MG tablet Take 1 tablet (25 mg total) by mouth every 8 (eight) hours as needed for nausea (Vertigo). 04/03/16 04/10/16  Dwana Melenaobin Graham Hyun, DO    Family History No family history on file.  Social History Social History  Substance Use Topics  . Smoking status: Never Smoker  . Smokeless tobacco: Never Used  . Alcohol use Yes     Comment: occasionally     Allergies   Patient has no known allergies.   Review of Systems Review of Systems  Constitutional: Negative for chills and fever.  HENT: Negative for ear pain, sore throat and tinnitus.   Eyes: Negative for visual disturbance.  Respiratory: Negative for cough and shortness of breath.   Cardiovascular: Negative for chest pain, palpitations and syncope.  Gastrointestinal: Positive for nausea. Negative for abdominal pain and vomiting.  Musculoskeletal: Negative for back pain and neck pain.       L posterior shoulder tightness  Skin: Negative for  rash.  Neurological: Positive for dizziness and numbness (L finger numbness). Negative for seizures, syncope, facial asymmetry, speech difficulty, weakness, light-headedness and headaches.  Psychiatric/Behavioral: Negative for confusion. The patient is nervous/anxious.   All other systems reviewed and are negative.    Physical Exam Updated Vital Signs BP 107/88   Pulse 106   Temp 98.4 F (36.9 C) (Oral)   Resp (!) 27   Ht 5\' 6"  (1.676 m)   Wt 83.9 kg   SpO2 100%   BMI 29.86 kg/m   Physical Exam  Constitutional: She appears well-developed and well-nourished. No  distress.  HENT:  Head: Normocephalic and atraumatic.  Mouth/Throat: Oropharynx is clear and moist.  Eyes: Conjunctivae and EOM are normal. Pupils are equal, round, and reactive to light. Right eye exhibits normal extraocular motion and no nystagmus. Left eye exhibits normal extraocular motion and no nystagmus.  Neck: Neck supple.  Cardiovascular: Normal rate, regular rhythm and intact distal pulses.   No murmur heard. Pulmonary/Chest: Effort normal and breath sounds normal. No respiratory distress. She has no wheezes. She has no rales.  Abdominal: Soft. There is no tenderness.  Musculoskeletal: She exhibits no edema.  Neurological: She is alert. She has normal strength. No cranial nerve deficit or sensory deficit (decreased sensation over L thumb and index finger volar aspect but not dorsal aspect). GCS eye subscore is 4. GCS verbal subscore is 5. GCS motor subscore is 6.  5/5 strength in bilateral upper and lower extremities. Coordination is normal. When testing Romberg patient very jerky and wobbly but takes several seconds to lean to one side. When testing gait patient very hesitant but gait is not wide-based and does not appear truly ataxic.  Skin: Skin is warm and dry.  Psychiatric: Her mood appears anxious.  Nursing note and vitals reviewed.    ED Treatments / Results  Labs (all labs ordered are listed, but only abnormal results are displayed) Labs Reviewed  BASIC METABOLIC PANEL - Abnormal; Notable for the following:       Result Value   CO2 20 (*)    Glucose, Bld 113 (*)    All other components within normal limits  URINALYSIS, ROUTINE W REFLEX MICROSCOPIC - Abnormal; Notable for the following:    Specific Gravity, Urine 1.041 (*)    All other components within normal limits  CBC    EKG  EKG Interpretation  Date/Time:  Friday April 02 2016 12:05:24 EST Ventricular Rate:  96 PR Interval:  126 QRS Duration: 78 QT Interval:  358 QTC Calculation: 452 R  Axis:   64 Text Interpretation:  Normal sinus rhythm Normal ECG no prior EKG  Confirmed by LIU MD, DANA [phone]) on 04/02/2016 3:24:01 PM       Radiology Ct Angio Head W Or Wo Contrast  Result Date: 04/02/2016 CLINICAL DATA:  45 year old female with vertigo, ataxia and worsening falls. Initial encounter. EXAM: CT ANGIOGRAPHY HEAD AND NECK TECHNIQUE: Multidetector CT imaging of the head and neck was performed using the standard protocol during bolus administration of intravenous contrast. Multiplanar CT image reconstructions and MIPs were obtained to evaluate the vascular anatomy. Carotid stenosis measurements (when applicable) are obtained utilizing NASCET criteria, using the distal internal carotid diameter as the denominator. CONTRAST:  50 cc Isovue 370. COMPARISON:  08/20/2015 cervical spine MR. 06/03/2003 brain MR. FINDINGS: CT HEAD FINDINGS Brain: No intracranial hemorrhage or CT evidence of large acute infarct. No intracranial enhancing lesion. No hydrocephalus. Vascular: Major intracranial vascular structures are patent as discussed below.  Skull: No skull fracture. Sinuses: Mastoid air cells, middle ear cavities and visualized paranasal sinuses are clear. Orbits: No acute orbital abnormality. Review of the MIP images confirms the above findings CTA NECK FINDINGS Aortic arch: Common origin innominate and left common carotid artery. Right carotid system: No dissection or narrowing. Left carotid system: No dissection or narrowing. Vertebral arteries: Codominant vertebral arteries without dissection or narrowing. Skeleton: Reversal normal cervical lordosis.  No fracture noted. Other neck: No worrisome mass. Upper chest: No worrisome mass. Review of the MIP images confirms the above findings CTA HEAD FINDINGS Anterior circulation: Anterior circulation without medium or large size vessel significant stenosis or occlusion. Posterior circulation: Patent small vertebral arteries and basilar artery. Small  caliber may be related to fetal origin of the posterior cerebral arteries. Venous sinuses: Patent with major drainage the right. Anatomic variants: Fetal origin posterior cerebral arteries. Delayed phase: As above Review of the MIP images confirms the above findings IMPRESSION: No acute abnormality identified. Specifically, no evidence of carotid or vertebral artery dissection. No CT evidence of large acute infarct. Of note is that history on 06/03/2003 MR states the patient had demyelinating lesion in the thoracic spine. By CT, it is difficult to exclude demyelinating process or subtle posterior fossa infarct and in if these possibilities were of high clinical concern, MR brain may be considered for further delineation. Additionally, MR thoracic spine can be obtained. Please see above for further detail. Electronically Signed   By: Lacy Duverney M.D.   On: 04/02/2016 17:55   Ct Angio Neck W And/or Wo Contrast  Result Date: 04/02/2016 CLINICAL DATA:  45 year old female with vertigo, ataxia and worsening falls. Initial encounter. EXAM: CT ANGIOGRAPHY HEAD AND NECK TECHNIQUE: Multidetector CT imaging of the head and neck was performed using the standard protocol during bolus administration of intravenous contrast. Multiplanar CT image reconstructions and MIPs were obtained to evaluate the vascular anatomy. Carotid stenosis measurements (when applicable) are obtained utilizing NASCET criteria, using the distal internal carotid diameter as the denominator. CONTRAST:  50 cc Isovue 370. COMPARISON:  08/20/2015 cervical spine MR. 06/03/2003 brain MR. FINDINGS: CT HEAD FINDINGS Brain: No intracranial hemorrhage or CT evidence of large acute infarct. No intracranial enhancing lesion. No hydrocephalus. Vascular: Major intracranial vascular structures are patent as discussed below. Skull: No skull fracture. Sinuses: Mastoid air cells, middle ear cavities and visualized paranasal sinuses are clear. Orbits: No acute orbital  abnormality. Review of the MIP images confirms the above findings CTA NECK FINDINGS Aortic arch: Common origin innominate and left common carotid artery. Right carotid system: No dissection or narrowing. Left carotid system: No dissection or narrowing. Vertebral arteries: Codominant vertebral arteries without dissection or narrowing. Skeleton: Reversal normal cervical lordosis.  No fracture noted. Other neck: No worrisome mass. Upper chest: No worrisome mass. Review of the MIP images confirms the above findings CTA HEAD FINDINGS Anterior circulation: Anterior circulation without medium or large size vessel significant stenosis or occlusion. Posterior circulation: Patent small vertebral arteries and basilar artery. Small caliber may be related to fetal origin of the posterior cerebral arteries. Venous sinuses: Patent with major drainage the right. Anatomic variants: Fetal origin posterior cerebral arteries. Delayed phase: As above Review of the MIP images confirms the above findings IMPRESSION: No acute abnormality identified. Specifically, no evidence of carotid or vertebral artery dissection. No CT evidence of large acute infarct. Of note is that history on 06/03/2003 MR states the patient had demyelinating lesion in the thoracic spine. By CT, it is difficult to  exclude demyelinating process or subtle posterior fossa infarct and in if these possibilities were of high clinical concern, MR brain may be considered for further delineation. Additionally, MR thoracic spine can be obtained. Please see above for further detail. Electronically Signed   By: Lacy DuverneySteven  Olson M.D.   On: 04/02/2016 17:55   Mr Laqueta JeanBrain W And Wo Contrast  Result Date: 04/02/2016 CLINICAL DATA:  45 year old female with ataxia, vertigo and dizziness. Subsequent encounter. EXAM: MRI HEAD WITHOUT AND WITH CONTRAST TECHNIQUE: Multiplanar, multiecho pulse sequences of the brain and surrounding structures were obtained without and with intravenous  contrast. CONTRAST:  18mL MULTIHANCE GADOBENATE DIMEGLUMINE 529 MG/ML IV SOLN COMPARISON:  CT angiogram head and neck 04/02/2016. Brain MR 06/03/2003. FINDINGS: Brain: No acute infarct or intracranial hemorrhage. No evidence of intracranial demyelinating process. No intracranial mass or abnormal enhancement. No hydrocephalus or age advanced atrophy. Vascular: Major intracranial vascular structures are patent. Skull and upper cervical spine: Negative. Sinuses/Orbits: No acute orbital abnormality. Visualized sinuses are clear. Other: Negative IMPRESSION: No acute infarct or evidence of intracranial demyelinating process. Electronically Signed   By: Lacy DuverneySteven  Olson M.D.   On: 04/02/2016 20:57    Procedures Procedures (including critical care time)  Medications Ordered in ED Medications  ondansetron (ZOFRAN-ODT) 4 MG disintegrating tablet (not administered)  ondansetron (ZOFRAN-ODT) disintegrating tablet 4 mg (4 mg Oral Given 04/02/16 1203)  meclizine (ANTIVERT) tablet 25 mg (25 mg Oral Given 04/02/16 1615)  iopamidol (ISOVUE-370) 76 % injection (50 mLs  Contrast Given 04/02/16 1647)  ibuprofen (ADVIL,MOTRIN) tablet 600 mg (600 mg Oral Given 04/02/16 1803)  gadobenate dimeglumine (MULTIHANCE) injection 20 mL (18 mLs Intravenous Contrast Given 04/02/16 2024)  promethazine (PHENERGAN) injection 25 mg (25 mg Intravenous Given 04/02/16 2256)     Initial Impression / Assessment and Plan / ED Course  I have reviewed the triage vital signs and the nursing notes.  Pertinent labs & imaging results that were available during my care of the patient were reviewed by me and considered in my medical decision making (see chart for details).  Clinical Course    Patient is a 45 year old female with history of fibromyalgia, anxiety who presents with 2 days of vertigo symptoms and feeling unsteady on her feet. Patient states yesterday morning she was leaning her head to the left to stretch when she started having  vertiginous symptoms. Patient also started feeling numbness on the left thumb and index finger and her left arm felt "heavy". Patient mentions she is followed by neurology for intermittent numbness in her left hand and has been worked up for Lyme disease and MS before without any specific diagnosis.  On exam patient has 5/5 strength in all 4 extremities. Sensation is intact in all 4 extremities except for left thumb and index finger however this is only on the volar aspect and less so on the dorsal aspect patient also had mild numbness over the fifth finger as well. Overall the numbness did not seem very consistent in a dermatomal pattern. No significant radiculopathy with Spurling's test. Patient has no neck or back pain. On Romberg test patient was very jerky with her movements but took at least 7-10 seconds before she started leaning to one side. When testing her gait patient was able to walk with a narrow based gait however was very hesitant. Difficult to discern the patient has a truly ataxic gait.  Patient's vertigo seems to be positional in nature and is reproducible with position changes on exam. No nystagmus was noted on  the Hallpike. I did ask about patient's history of anxiety and any recent stressors. Patient did endorse numerous stressors of late including her husband taking a steady job recently, her 67 year old child starting to drive, her oldest child leaving the house for college this year as well as preparing for the holidays. Patient states all the above has been horribly stressful for her. Based on atypical presentation of her neurological symptoms and her history of atypical neurological symptoms concern for conversion disorder.  CTA head and neck was ordered which showed no abnormal findings. I reassured the patient that these signs were normal and faster if her symptoms were better. Patient states her symptoms remain the same. I spoke with neurology who agreed that MRI is needed to rule  out cerebellar pathology or demyelinating lesion given her Romberg test and unsteady gait. MRI did not show any abnormalities. Patient updated on the results and she still complains of vertiginous symptoms and feeling unsteady.  The symptoms may have be secondary to positional vertigo. Meclizine did help earlier. Patient given 1 dose of IV Phenergan which did improve her vertigo symptoms. Patient still very hesitant with her gait however does not appear truly ataxic. I spoke with neurology again and they agree we should treat BPPV and workup is benign.   Patient will be discharged with prescription for Phenergan tablets and meclizine as well. Patient is encouraged to limit her stress, take the medications for symptom control and stay hydrated and eat a healthy diet. Patient has a follow-up appointment next Thursday with neurology. Patient encouraged to discuss these symptoms with him. Patient may also likely benefit from counseling and outpatient psychiatry for her anxiety.   Pt seen with attending Dr. Verdie Mosher.   Final Clinical Impressions(s) / ED Diagnoses   Final diagnoses:  Peripheral vertigo, unspecified laterality  Conversion disorder    New Prescriptions New Prescriptions   MECLIZINE (ANTIVERT) 25 MG TABLET    Take 1 tablet (25 mg total) by mouth 3 (three) times daily as needed for dizziness.   PROMETHAZINE (PHENERGAN) 25 MG TABLET    Take 1 tablet (25 mg total) by mouth every 8 (eight) hours as needed for nausea (Vertigo).     Dwana Melena, DO 04/03/16 [id number]    Lavera Guise, MD 04/03/16 [phone]

## 2016-04-03 MED ORDER — MECLIZINE HCL 25 MG PO TABS: 25.0000 mg | ORAL_TABLET | Freq: Three times a day (TID) | ORAL | 0 refills | Status: AC | PRN

## 2016-04-03 MED ORDER — PROMETHAZINE HCL 25 MG PO TABS: 25.0000 mg | ORAL_TABLET | Freq: Three times a day (TID) | ORAL | 0 refills | Status: DC | PRN

## 2017-10-18 ENCOUNTER — Ambulatory Visit (INDEPENDENT_AMBULATORY_CARE_PROVIDER_SITE_OTHER)
Admission: RE | Admit: 2017-10-18 | Discharge: 2017-10-18 | Disposition: A | Discharge status: Home / Self Care | Payer: Managed Care, Other (non HMO) | Source: Ambulatory Visit | Attending: Pulmonary Disease | Admitting: Pulmonary Disease

## 2017-10-18 ENCOUNTER — Encounter: Payer: Self-pay | Admitting: Pulmonary Disease

## 2017-10-18 ENCOUNTER — Ambulatory Visit (INDEPENDENT_AMBULATORY_CARE_PROVIDER_SITE_OTHER): Payer: Managed Care, Other (non HMO) | Admitting: Pulmonary Disease

## 2017-10-18 VITALS — BP 128/80 | HR 102 | Ht 66.0 in | Wt 186.0 lb

## 2017-10-18 DIAGNOSIS — R059 Cough, unspecified: Secondary | ICD-10-CM

## 2017-10-18 DIAGNOSIS — R05 Cough: Secondary | ICD-10-CM

## 2017-10-18 LAB — POCT EXHALED NITRIC OXIDE: FENO LEVEL (PPB): 15

## 2017-10-18 MED ORDER — BENZONATATE 200 MG PO CAPS
200.0000 mg | ORAL_CAPSULE | Freq: Three times a day (TID) | ORAL | 1 refills | Status: DC | PRN
Start: 2017-10-18 — End: 2021-06-16

## 2017-10-18 MED ORDER — HYDROCOD POLST-CPM POLST ER 10-8 MG/5ML PO SUER: 5.0000 mL | Freq: Every evening | ORAL | 0 refills | Status: DC | PRN

## 2017-10-18 MED ORDER — FLUTICASONE PROPIONATE 50 MCG/ACT NA SUSP: 1.0000 | Freq: Every day | NASAL | 2 refills | Status: AC

## 2017-10-18 NOTE — Progress Notes (Signed)
[facility]  Chief Complaint  Patient presents with  . pulm consult    Pt is self referral for persistent cough. Pt has productive cough-yellow/green, SOB with exertion, some wheezing, and increase of chest tightness. Pt has tried OTC for cough, nothing helped, [facility], and cough syrup with antibotics nothing worked.    Constitutional: BP 128/80 (BP Location: Left Arm, Cuff Size: Normal)   Pulse (!) 102   Ht 5\' 6"  (1.676 m)   Wt 186 lb (84.4 kg)   SpO2 100%   BMI 30.02 kg/m   History of Present Illness: Karen Haney is a 47 y.o. female with a cough.  She was well until a few weeks ago.  She then developed a cough.  She doesn't recall anything that triggered her cough regarding an exposure.  She was bringing up sputum, but is now more dry.  She has some post nasal drip.  Her cough is triggered when she talks.    She hasn't noticed fever, ear pain, itchy eyes, difficulty swallowing.  No gland swelling, skin rashes.  Not having wheeze, chest pain, or abdominal pain.  Doesn't feel short of breath.  Has history of reflux and has been using prilosec.  She doesn't feel her reflux is an issue at present.  She went to urgent care twice.  She was treated with steroid shot, zpak, and albuterol.  These didn't help.  She was given script for codeine cough medicine and was able to sleep better.  She uses nasal irrigation, flonase, and afrin intermittently.  She hasn't been using these much recently.  She has been using Halls cough drops, but these haven't helped.  She had pet dogs.  She quit smoking years ago.  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   Comprehensive Respiratory Exam:  Appearance - well kempt, frequent coughing spells ENMT - nasal mucosa moist, turbinates clear, midline nasal septum, clear nasal drainage, no dental lesions, no gingival bleeding, no oral exudates, no tonsillar  hypertrophy Neck - no masses, trachea midline, no thyromegaly, no elevation in JVP Respiratory - normal appearance of chest wall, normal respiratory effort w/o accessory muscle use, no dullness on percussion, no wheezing or rales CV - s1s2 regular rate and rhythm, no murmurs, no peripheral edema, no varicosities, radial pulses symmetric GI - soft, non tender, no masses, no hepatosplenomegaly Lymph - no adenopathy noted in neck and axillary areas MSK - normal muscle strength and tone, normal gait Ext - no cyanosis, clubbing, or joint inflammation noted Skin - no rashes, lesions, or ulcers Neuro - oriented to person, place, and time Psych - normal mood and affect  Discussion: She has cough that has persisted for several weeks.  She has a prior history of smoking.  She doesn't have symptoms to suggest asthma, her FeNO is not significantly elevated, and she didn't improve after therapy for asthma.  She has history of reflux and has been on therapy for this.  Her clinical history doesn't suggest that her reflux has progressed.  I suspect she is having upper airway cough with post nasal drip triggering cyclic cough.  Plan: Nasal irrigation (saline nasal spray) daily Flonase 1 spray in each nostril daily Tessalon 200 mg pill every 8 hours as needed for cough Tussionex 5 ml nightly >> this medicine can make your sleepy Sip on water when you have an urge to cough Salt water  gargles twice per day Use sugarless candy to keep your mouth moist Rest your voice as much as possible Avoid forcing a cough or clearing your throat Chest xray today  Follow up in 2 weeks with Dr. Craige Cotta or Nurse Practitioner    Coralyn Helling, MD [facility] 10/18/2017, 3:01 PM  Flow Sheet  Pulmonary tests: FeNO 10/18/17 >> 15  Review of Systems: Constitutional: Negative for fever and unexpected weight change.  HENT: Positive for trouble swallowing and voice change. Negative for congestion, dental  problem, ear pain, nosebleeds, postnasal drip, rhinorrhea, sinus pressure, sneezing and sore throat.   Eyes: Negative for redness and itching.  Respiratory: Positive for cough, choking, chest tightness, shortness of breath and wheezing.   Cardiovascular: Negative for palpitations and leg swelling.  Gastrointestinal: Negative for nausea and vomiting.  Genitourinary: Negative for dysuria.  Musculoskeletal: Negative for joint swelling.  Skin: Negative for rash.  Allergic/Immunologic: Positive for environmental allergies. Negative for food allergies and immunocompromised state.  Neurological: Positive for headaches.  Hematological: Does not bruise/bleed easily.  Psychiatric/Behavioral: Negative for dysphoric mood. The patient is not nervous/anxious.    Past Medical History: She  has a past medical history of Thyroid disease.  Past Surgical History: She has not prior surgeries.  Family History: Her mother and sister had cancer.  Social History: She  reports that she quit smoking about 20 years ago. Her smoking use included cigarettes. She has a 7.00 pack-year smoking history. She has never used smokeless tobacco. She reports that she drinks alcohol. She reports that she does not use drugs.  Medications: Allergies as of 10/18/2017   No Known Allergies     Medication List        Accurate as of 10/18/17  3:01 PM. Always use your most recent med list.          benzonatate 200 MG capsule Commonly known as:  TESSALON Take 1 capsule (200 mg total) by mouth 3 (three) times daily as needed for cough.   BIOTIN PO Take 1 tablet by mouth daily.   chlorpheniramine-HYDROcodone 10-8 MG/5ML Suer Commonly known as:  TUSSIONEX PENNKINETIC ER Take 5 mLs by mouth at bedtime as needed for cough.   [employer] FIBER GUMMIES PO Take 1 each by mouth daily.   fluticasone 50 MCG/ACT nasal spray Commonly known as:  FLONASE Place 1 spray into both nostrils daily.   MAGNESIUM PO Take 1 tablet by mouth at  bedtime.   omeprazole 20 MG capsule Commonly known as:  PRILOSEC Take 20 mg by mouth daily.   progesterone 100 MG capsule Commonly known as:  PROMETRIUM Take 100 mg by mouth at bedtime.   thyroid 90 MG tablet Commonly known as:  ARMOUR Take 90 mg by mouth daily.   VITAMIN D3 PO Take 1 tablet by mouth 2 (two) times daily.   zolpidem 10 MG tablet Commonly known as:  AMBIEN Take 10 mg by mouth at bedtime.

## 2017-10-18 NOTE — Progress Notes (Signed)
   Subjective:    Patient ID: Karen Haney, female    DOB: [birth year], 47 y.o.   MRN: [id number]  HPI    Review of Systems  Constitutional: Negative for fever and unexpected weight change.  HENT: Positive for trouble swallowing and voice change. Negative for congestion, dental problem, ear pain, nosebleeds, postnasal drip, rhinorrhea, sinus pressure, sneezing and sore throat.   Eyes: Negative for redness and itching.  Respiratory: Positive for cough, choking, chest tightness, shortness of breath and wheezing.   Cardiovascular: Negative for palpitations and leg swelling.  Gastrointestinal: Negative for nausea and vomiting.  Genitourinary: Negative for dysuria.  Musculoskeletal: Negative for joint swelling.  Skin: Negative for rash.  Allergic/Immunologic: Positive for environmental allergies. Negative for food allergies and immunocompromised state.  Neurological: Positive for headaches.  Hematological: Does not bruise/bleed easily.  Psychiatric/Behavioral: Negative for dysphoric mood. The patient is not nervous/anxious.        Objective:   Physical Exam        Assessment & Plan:

## 2017-10-18 NOTE — Patient Instructions (Addendum)
Nasal irrigation (saline nasal spray) daily Flonase 1 spray in each nostril daily Tessalon 200 mg pill every 8 hours as needed for cough Tussionex 5 ml nightly >> this medicine can make your sleepy Sip on water when you have an urge to cough Salt water gargles twice per day Use sugarless candy to keep your mouth moist Rest your voice as much as possible Avoid forcing a cough or clearing your throat Chest xray today  Follow up in 2 weeks with Dr. Craige CottaSood or Nurse Practitioner

## 2017-10-20 ENCOUNTER — Telehealth: Payer: Self-pay | Admitting: Pulmonary Disease

## 2017-10-20 NOTE — Telephone Encounter (Signed)
Pt is aware of results and voiced her understanding. Nothing further is needed.  

## 2017-10-20 NOTE — Telephone Encounter (Signed)
Dg Chest 2 View  Result Date: 10/19/2017 CLINICAL DATA:  Cough for 5 weeks, former smoking history EXAM: CHEST - 2 VIEW COMPARISON:  Chest x-ray of 06/23/2015 FINDINGS: No active infiltrate or effusion is seen. Mediastinal and hilar contours are unremarkable. The heart is within normal limits in size. No bony abnormality is seen. IMPRESSION: No active cardiopulmonary disease. Electronically Signed   By: Dwyane DeePaul  Barry M.D.   On: 10/19/2017 08:01     Please let her know her chest xray was normal.

## 2017-11-01 ENCOUNTER — Ambulatory Visit: Payer: Managed Care, Other (non HMO) | Admitting: Primary Care

## 2018-09-02 IMAGING — CT CT ANGIO HEAD
1 of 12 series · 5 of 33 positions shown · IV contrast (OMNI 350)
Comparison: 08/20/2015 cervical spine MR. 06/03/2003 brain MR.

CLINICAL DATA: 45-year-old female with vertigo, ataxia and
worsening falls. Initial encounter.

EXAM:
CT ANGIOGRAPHY HEAD AND NECK
TECHNIQUE: Multidetector CT imaging of the head and neck was performed using
the standard protocol during bolus administration of intravenous
contrast. Multiplanar CT image reconstructions and MIPs were
obtained to evaluate the vascular anatomy. Carotid stenosis
measurements (when applicable) are obtained utilizing NASCET
criteria, using the distal internal carotid diameter as the
denominator.
CONTRAST:  50 cc Isovue 370.

[Series 12: cta neck axial · axial · 0.48mm/px · z∈[-298,-74]mm · 5 of 337 slices shown]
[im 57/337  soft-tissue]
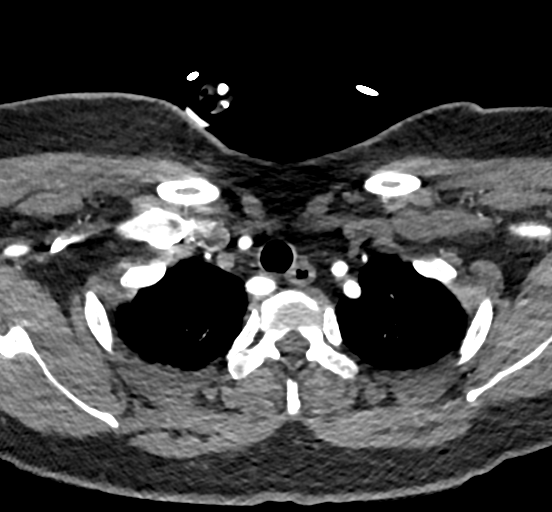
[im 113/337  bone]
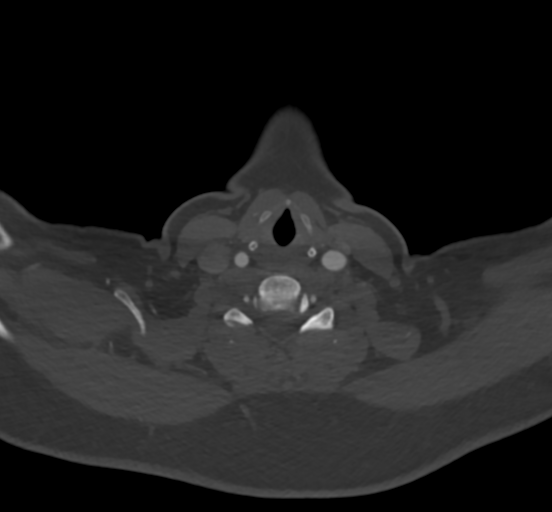
[im 169/337  soft-tissue]
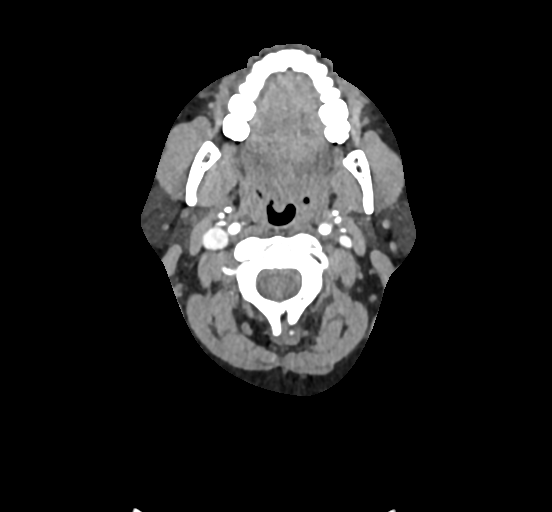
[im 225/337  bone]
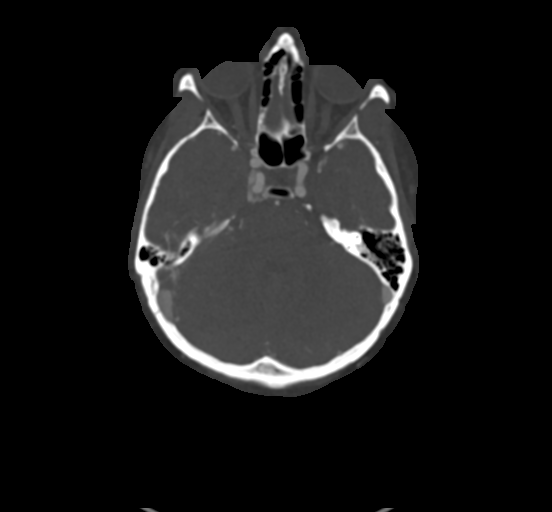
[im 281/337  soft-tissue]
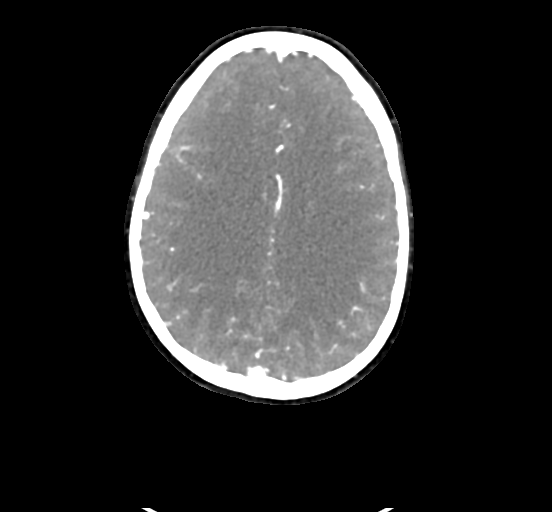

[5 of 33 positions shown; findings below may reference images not displayed]

FINDINGS: CT HEAD FINDINGS

Brain: No intracranial hemorrhage or CT evidence of large acute
infarct.

No intracranial enhancing lesion.

No hydrocephalus.

Vascular: Major intracranial vascular structures are patent as
discussed below.

Skull: No skull fracture.

Sinuses: Mastoid air cells, middle ear cavities and visualized
paranasal sinuses are clear.

Orbits: No acute orbital abnormality.

Review of the MIP images confirms the above findings

CTA NECK FINDINGS

Aortic arch: Common origin innominate and left common carotid
artery.

Right carotid system: No dissection or narrowing.

Left carotid system: No dissection or narrowing.

Vertebral arteries: Codominant vertebral arteries without dissection
or narrowing.

Skeleton: Reversal normal cervical lordosis.  No fracture noted.

Other neck: No worrisome mass.

Upper chest: No worrisome mass.

Review of the MIP images confirms the above findings

CTA HEAD FINDINGS

Anterior circulation: Anterior circulation without medium or large
size vessel significant stenosis or occlusion.

Posterior circulation: Patent small vertebral arteries and basilar
artery. Small caliber may be related to fetal origin of the
posterior cerebral arteries.

Venous sinuses: Patent with major drainage the right.

Anatomic variants: Fetal origin posterior cerebral arteries.

Delayed phase: As above

Review of the MIP images confirms the above findings
IMPRESSION: No acute abnormality identified. Specifically, no evidence of
carotid or vertebral artery dissection.

No CT evidence of large acute infarct.

Of note is that history on 06/03/2003 MR [facility] the patient had
demyelinating lesion in the thoracic spine. By CT, it is difficult
to exclude demyelinating process or subtle posterior fossa infarct
and in if these possibilities were of high clinical concern, MR
brain may be considered for further delineation. Additionally, MR
thoracic spine can be obtained.

Please see above for further detail.

## 2020-07-08 ENCOUNTER — Other Ambulatory Visit: Payer: Self-pay | Admitting: Urology

## 2020-07-08 DIAGNOSIS — R102 Pelvic and perineal pain: Secondary | ICD-10-CM

## 2020-07-14 ENCOUNTER — Other Ambulatory Visit: Payer: Self-pay | Admitting: Surgery

## 2020-07-24 ENCOUNTER — Other Ambulatory Visit: Payer: Self-pay | Admitting: Urology

## 2020-07-24 DIAGNOSIS — R102 Pelvic and perineal pain: Secondary | ICD-10-CM

## 2020-11-17 ENCOUNTER — Other Ambulatory Visit: Payer: Self-pay | Admitting: Urology

## 2021-01-14 ENCOUNTER — Encounter (HOSPITAL_COMMUNITY): Admission: RE | Admit: 2021-01-14 | Payer: Managed Care, Other (non HMO) | Source: Ambulatory Visit

## 2021-01-16 ENCOUNTER — Ambulatory Visit (HOSPITAL_BASED_OUTPATIENT_CLINIC_OR_DEPARTMENT_OTHER): Admit: 2021-01-16 | Payer: Managed Care, Other (non HMO) | Admitting: Obstetrics and Gynecology

## 2021-01-16 ENCOUNTER — Encounter (HOSPITAL_BASED_OUTPATIENT_CLINIC_OR_DEPARTMENT_OTHER): Payer: Self-pay

## 2021-01-16 SURGERY — ANTERIOR AND POSTERIOR REPAIR WITH SACROSPINOUS FIXATION
Anesthesia: Choice

## 2021-06-16 ENCOUNTER — Encounter: Payer: Self-pay | Admitting: Internal Medicine

## 2021-06-16 ENCOUNTER — Other Ambulatory Visit: Payer: Self-pay

## 2021-06-16 ENCOUNTER — Ambulatory Visit: Payer: 59 | Admitting: Internal Medicine

## 2021-06-16 VITALS — BP 110/82 | HR 101 | Ht 66.0 in | Wt 200.0 lb

## 2021-06-16 DIAGNOSIS — E039 Hypothyroidism, unspecified: Secondary | ICD-10-CM

## 2021-06-16 DIAGNOSIS — Z780 Asymptomatic menopausal state: Secondary | ICD-10-CM

## 2021-06-16 NOTE — Progress Notes (Signed)
Patient ID: Karen Haney, female   DOB: 05/30/1970, 51 y.o.   MRN: [id number]  This visit occurred during the SARS-CoV-2 public health emergency.  Safety protocols were in place, including screening questions prior to the visit, additional usage of staff PPE, and extensive cleaning of exam room while observing appropriate contact time as indicated for disinfecting solutions.   HPI  Karen Haney is a 51 y.o.-year-old female, referred by her PCP, Karen Haney, for management of hypothyroidism.  Patient describes that she was diagnosed with hypothyroidism "years ago" and was started on levothyroxine.  However, she persisted to have fatigue/exhaustion, hair loss, nail changes, brain fog, vertigo, purple discoloration of fingers despite normal TFTs.   She ended up seeing  Karen Haney >> she was changed to Armour Thyroid 90 mg daily.  She initially felt well on this, but then she started to experience palpitations.  She saw cardiology - ruled out a cardiac problem.  She saw Dr. Collene Haney in 11/2020 for constipation, bloating, and a TSH was very low.  She was advised to stop Armour.  She did so.  She felt much better after stopping this.  She saw Dr. Maia Haney in 12/2020 and at that time a TSH was 14 >> started  LT4 50 mcg daily >> now 75 mcg daily.   She recently had TFTs checked by Dr. Maia Haney and these were normal.  She takes LT4 75 mcg daily: - fasting - with water - separated by >30 min from b'fast  - no calcium, iron - + PPIs (Omeprazole) 1h later - no multivitamins   I reviewed pt's thyroid tests: 05/26/2021: TSH 3.23, TT4 9.8  12/25/2020: TSH 14.01 11/25/2020: TSH 0.021 No results found for: TSH, FREET4, T3FREE  Antithyroid antibodies: Per patient's recall, these were normal as checked by integrative medicine in the past. No results found for: THGAB No components found for: TPOAB  Pt describes: - weight gain, increased appetite -started on Wegovy 3 weeks ago,  still on the lowest dose - fatigue - no heat or cold intolerance - poor sleep  - no depression, but has anxiety - constipation - dry skin - hair loss  Pt denies feeling nodules in neck, dysphagia/odynophagia, SOB with lying down.  She has hoarseness and lower dysphagia - has GERD and a small hiatal hernia per Barium swallow test from 2012. On Omeprazole.  She has + FH of thyroid disorders in: mother and sister. No FH of thyroid cancer.  No h/o radiation tx to head or neck. No recent use of iodine supplements.  Pt. also has a history of palpitations, obesity, GERD-on omeprazole 20 mg daily, constipation, depression/anxiety. She is on hormone replacement Haney with estradiol 2 mg p.o. daily and Prometrium 300 mg nightly (!). She describes triglycerides in the 700s, which recently improved to upper 200s - she is on Rosuvastatin and Wegovy.  She exercises 3-5 times a week: Pilates, swimming, walking, vibration plate, yoga.  ROS: Constitutional: + See HPI Eyes: +Blurry vision, no xerophthalmia ENT: no sore throat, + see HPI + decreased hearing Cardiovascular: no CP/+ SOB/no palpitations/leg swelling Respiratory: no cough/+ SOB Gastrointestinal: no N/V/D/+ C/+ heartburn Musculoskeletal: + muscle aches/no joint aches Skin: no rashes, + hair loss Neurological: no tremors/numbness/tingling/dizziness Psychiatric: no depression/+ anxiety  Past Medical History:  Diagnosis Date   Thyroid disease    No past surgical history on file. Social History   Socioeconomic History   Marital status: Married    Spouse name: Not on file   Number  of children: 2   Years of education: Not on file   Highest education level: Not on file  Occupational History   Occupation: Front office OR physical therapist  Tobacco Use   Smoking status: Former    Packs/day: 1.00    Years: 7.00    Pack years: 7.00    Types: Cigarettes    Quit date: 04/13/1995    Years since quitting: 26.1   Smokeless  tobacco: Never  Vaping Use   Vaping Use: Never used  Substance and Sexual Activity   Alcohol use: Yes    Alcohol/week: 2.0 - 4.0 standard drinks    Types: 1 - 2 Glasses of wine, 1 - 2 Shots of liquor per week    Comment: 2-3 drinks of wine or whiskey weekly   Drug use: No   Sexual activity: Yes    Birth control/protection: Post-menopausal, Surgical  Other Topics Concern   Not on file  Social History Narrative   Not on file   Social Determinants of Health   Financial Resource Strain: Not on file  Food Insecurity: Not on file  Transportation Needs: Not on file  Physical Activity: Not on file  Stress: Not on file  Social Connections: Not on file  Intimate Partner Violence: Not on file   Current Outpatient Medications on File Prior to Visit  Medication Sig Dispense Refill   BIOTIN PO Take 1 tablet by mouth daily.     fluticasone (FLONASE) 50 MCG/ACT nasal spray Place 1 spray into both nostrils daily. 16 g 2   levothyroxine (SYNTHROID) 75 MCG tablet Take 75 mcg by mouth 3 (three) times daily.     MAGNESIUM PO Take 1 tablet by mouth at bedtime.     omeprazole (PRILOSEC) 20 MG capsule Take 20 mg by mouth daily.     ondansetron (ZOFRAN-ODT) 4 MG disintegrating tablet Take 4 mg by mouth 3 (three) times daily as needed.     progesterone (PROMETRIUM) 100 MG capsule Take 100 mg by mouth at bedtime.     rosuvastatin (CRESTOR) 20 MG tablet SMARTSIG:1 Tablet(s) By Mouth Every Evening     WEGOVY 0.25 MG/0.5ML SOAJ SMARTSIG:0.25 Milligram(s) SUB-Q Once a Week     zolpidem (AMBIEN) 10 MG tablet Take 10 mg by mouth at bedtime.  3   No current facility-administered medications on file prior to visit.   No Known Allergies Family History  Problem Relation Age of Onset   Thyroid disease Mother    Anxiety disorder Mother    Arthritis Mother    Cancer Mother    Hearing loss Mother    Miscarriages / [place] Mother    Arthritis Father    COPD Father    Diabetes Father    Hearing loss  Father    Heart disease Father    Hypertension Father    Hyperlipidemia Father    Anxiety disorder Sister    Miscarriages / [place] Sister    Cancer Sister    Miscarriages / Stillbirths Sister    Anxiety disorder Brother    Hearing loss Brother    Hypertension Brother    Hypertension Brother     PE: BP 110/82 (BP Location: Left Arm, Patient Position: Sitting, Cuff Size: Normal)    Pulse (!) 101    Ht 5\' 6"  (1.676 m)    Wt 200 lb (90.7 kg)    SpO2 97%    BMI 32.28 kg/m  Wt Readings from Last 3 Encounters:  06/16/21 200 lb (90.7 kg)  10/18/17 186 lb (84.4 kg)  04/02/16 185 lb (83.9 kg)   Constitutional: overweight, in NAD Eyes: PERRLA, EOMI, no exophthalmos ENT: moist mucous membranes, questionable nodule palpated low in neck versus prominent thyroid, no cervical lymphadenopathy Cardiovascular: Tachycardia, RR, No MRG Respiratory: CTA B Musculoskeletal: no deformities, strength intact in all 4 Skin: dry, warm, no rashes Neurological: no tremor with outstretched hands, DTR normal in all 4  ASSESSMENT: 1. Hypothyroidism -Uncontrolled  2.  Menopause  PLAN:  1. Patient with long-standing hypothyroidism, initially on levothyroxine, with persistent hypothyroid symptoms, after which she was switched to Armour Haney by integrative medicine.  She was over replaced on this with very suppressed TSH at her visit with Dr. Collene Haney in 11/2020.  She was taken off Armour at that time.  She felt much better afterwards.  In 12/2020, her TSH was high off thyroid hormones, at 14.  At that time, she was started on levothyroxine, currently on 75 mcg daily.  On this dose, her TFTs were normal in 05/2021.  However, these were checked with patient on a very high dose of biotin, then 10,000 mcg daily.  I explained that this will artificially suppress the TSH level so she needs to be off this high dose of biotin for at least 2 weeks before checking the tests.  Moreover, she is not taking levothyroxine  correctly, taking omeprazole only 1 hour after taking it.  I advised her to separate these by at least 4 hours. - she appears euthyroid but does complain of weight gain.  She is also tachycardic at today's visit. - she has a prominent thyroid versus thyroid nodule.  At next visit, we may need to check a thyroid ultrasound - We discussed about correct intake of levothyroxine, fasting, with water, separated by at least 30 minutes from breakfast, and separated by more than 4 hours from calcium, iron, multivitamins, acid reflux medications (PPIs).  I advised her to move omeprazole at least 4 hours later. - will check thyroid tests in 5 to 6 weeks after she changes the omeprazole dosing and also stop biotin: TSH, free T3, free T4 and will also add TPO and ATA antibodies to screen for Hashimoto's thyroiditis.  We discussed about measures that improve her immune system and, therefore, decrease antibody levels in case they are elevated.  We also discussed about selenium, which we may try if her antibodies are elevated. - I will see her back in 3-4 months, but sooner for labs  2.  Menopause -Patient mentions hypertriglyceridemia.  I noticed that she is on p.o. estrogen and we discussed that this is more likely to increase her triglycerides.  I advised her to discuss with her OB/GYN provider whether this can be switched to an estrogen patch -I also noticed that she is on a very high dose of Prometrium, 300 mg daily continuous.  She tells me that this was increased due to her insomnia.  However, insomnia did not improve on 300 mg of Prometrium daily.  We discussed about possible risks of breast cancer in the setting of a high progesterone dose (although this was mostly seen with non bioidentical progestins in the past) and I advised her to also discuss with the treating provider whether the progesterone dose can be decreased to only 100 mg nightly  Orders Placed This Encounter  Procedures   TSH   T4, free   T3,  free   Thyroglobulin antibody   Thyroid peroxidase antibody   Philemon Kingdom, MD PhD [facility] Endocrinology

## 2021-06-16 NOTE — Patient Instructions (Addendum)
Please stop Biotin and come back for labs in 5-6 weeks. ? ?Please take Levothyroxine 75 mcg  every day, with water, at least 30 minutes before breakfast, separated by at least 4 hours from: ?- acid reflux medications ?- calcium ?- iron ?- multivitamins ? ?Please move Omeprazole >4 h after Levothyroxine. ? ?Please return in 3-4 months. ? ?

## 2021-07-23 ENCOUNTER — Other Ambulatory Visit (INDEPENDENT_AMBULATORY_CARE_PROVIDER_SITE_OTHER): Payer: 59

## 2021-07-23 DIAGNOSIS — E039 Hypothyroidism, unspecified: Secondary | ICD-10-CM | POA: Diagnosis not present

## 2021-07-23 LAB — T4, FREE: Free T4: 0.86 ng/dL (ref 0.60–1.60)

## 2021-07-23 LAB — TSH: TSH: 2.62 u[IU]/mL (ref 0.35–5.50)

## 2021-07-23 LAB — T3, FREE: T3, Free: 2.7 pg/mL (ref 2.3–4.2)

## 2021-07-27 LAB — THYROGLOBULIN ANTIBODY: Thyroglobulin Ab: <1 IU/mL (ref <=1)

## 2021-07-27 LAB — THYROID PEROXIDASE ANTIBODY: Thyroperoxidase Ab SerPl-aCnc: 1 IU/mL (ref <9)

## 2021-10-05 DIAGNOSIS — E668 Other obesity: Secondary | ICD-10-CM | POA: Diagnosis not present

## 2021-10-05 DIAGNOSIS — R110 Nausea: Secondary | ICD-10-CM | POA: Diagnosis not present

## 2021-10-05 DIAGNOSIS — E781 Pure hyperglyceridemia: Secondary | ICD-10-CM | POA: Diagnosis not present

## 2021-10-07 DIAGNOSIS — Z01419 Encounter for gynecological examination (general) (routine) without abnormal findings: Secondary | ICD-10-CM | POA: Diagnosis not present

## 2021-10-07 DIAGNOSIS — Z1231 Encounter for screening mammogram for malignant neoplasm of breast: Secondary | ICD-10-CM | POA: Diagnosis not present

## 2021-10-07 DIAGNOSIS — Z1272 Encounter for screening for malignant neoplasm of vagina: Secondary | ICD-10-CM | POA: Diagnosis not present

## 2021-10-07 DIAGNOSIS — Z6830 Body mass index (BMI) 30.0-30.9, adult: Secondary | ICD-10-CM | POA: Diagnosis not present

## 2021-10-20 ENCOUNTER — Ambulatory Visit: Payer: 59 | Admitting: Internal Medicine

## 2021-11-03 DIAGNOSIS — E668 Other obesity: Secondary | ICD-10-CM | POA: Diagnosis not present

## 2021-11-03 DIAGNOSIS — E039 Hypothyroidism, unspecified: Secondary | ICD-10-CM | POA: Diagnosis not present

## 2021-11-03 DIAGNOSIS — R110 Nausea: Secondary | ICD-10-CM | POA: Diagnosis not present

## 2021-11-03 DIAGNOSIS — K5900 Constipation, unspecified: Secondary | ICD-10-CM | POA: Diagnosis not present

## 2022-01-26 ENCOUNTER — Ambulatory Visit (INDEPENDENT_AMBULATORY_CARE_PROVIDER_SITE_OTHER): Payer: BC Managed Care – PPO | Admitting: Internal Medicine

## 2022-01-26 ENCOUNTER — Encounter: Payer: Self-pay | Admitting: Internal Medicine

## 2022-01-26 VITALS — BP 110/76 | HR 98 | Ht 66.0 in | Wt 184.6 lb

## 2022-01-26 DIAGNOSIS — E039 Hypothyroidism, unspecified: Secondary | ICD-10-CM | POA: Diagnosis not present

## 2022-01-26 DIAGNOSIS — R221 Localized swelling, mass and lump, neck: Secondary | ICD-10-CM

## 2022-01-26 LAB — T4, FREE: Free T4: 0.84 ng/dL (ref 0.60–1.60)

## 2022-01-26 LAB — TSH: TSH: 3.55 u[IU]/mL (ref 0.35–5.50)

## 2022-01-26 NOTE — Progress Notes (Signed)
Patient ID: Karen Haney, female   DOB: [birth year], 51 y.o.   MRN: [id number]  HPI  Karen Haney is a 51 y.o.-year-old female, referred by her PCP, Dr.Bakare, for management of hypothyroidism.  Last visit 7 months ago.  Interim history: Patient has no complaints at this visit. She feels that after she started [facility], she started to feel the best she felt in 20 years. She lost 16 lbs since last OV. She sleeps better. Her cholesterol, BP and HR are all better.  Reviewed history: Patient describes that she was diagnosed with hypothyroidism "years ago" and was started on levothyroxine.  However, she persisted to have fatigue/exhaustion, hair loss, nail changes, brain fog, vertigo, purple discoloration of fingers despite normal TFTs.   She ended up seeing  [facility] >> she was changed to Armour Thyroid 90 mg daily.  She initially felt well on this, but then she started to experience palpitations.  She saw cardiology - ruled out a cardiac problem.  She saw Dr. Collene Mares in 11/2020 for constipation, bloating, and a TSH was very low.  She was advised to stop Armour.  She did so.  She felt much better after stopping this.  She saw Dr. Maia Petties in 12/2020 and at that time a TSH was 14 >> started  LT4 50 mcg daily >> then increased to 75 mcg daily.   TFTs checked by Dr. Maia Petties after the above change and these were normal.  She takes LT4 75 mcg daily: - fasting - with water - separated by >30 min from b'fast  - no calcium, iron - + PPIs (Omeprazole) 1h later >> moved >4h later - no multivitamins   I reviewed pt's thyroid tests: Lab Results  Component Value Date   TSH 2.62 07/23/2021   FREET4 0.86 07/23/2021   T3FREE 2.7 07/23/2021  05/26/2021: TSH 3.23, TT4 9.8  12/25/2020: TSH 14.01 11/25/2020: TSH 0.021  Antithyroid antibodies: Per patient's recall, these were normal as checked by integrative medicine in the past. At last OV, we repeated these: Component      Latest Ref Rng 07/23/2021  Thyroperoxidase Ab SerPl-aCnc     <9 IU/mL 1   Thyroglobulin Ab     < or = 1 IU/mL <1    At our first visit, she described: - weight gain, increased appetite -started on Wegovy 3 weeks prior to the visit - fatigue - no heat or cold intolerance - poor sleep  - no depression, but + anxiety - constipation - dry skin - hair loss - mm aches  At today's visit, she continues to have constipation - had to come off Trulance 2/2 price >> may need to restart.  Pt denies feeling nodules in neck, dysphagia/odynophagia  She has hoarseness and lower dysphagia - has GERD and a small hiatal hernia per Barium swallow test from 2012. On Omeprazole.  She has + FH of thyroid disorders in: mother and sister. No FH of thyroid cancer.  No h/o radiation tx to head or neck. No recent use of iodine supplements.  Pt. also has a history of palpitations, obesity, GERD-on omeprazole, constipation, depression/anxiety.  She is on hormone replacement therapy with estradiol 2 mg p.o. daily and Prometrium 300 mg nightly (!) - s/p TAH in her 46s. Now on t-d estradiol.  She describes triglycerides in the 700s, which improved to upper 200s - on Rosuvastatin.  She exercises 3-5 times a week: Pilates, swimming, walking, vibration plate, yoga.  ROS: + See HPI  Past  Medical History:  Diagnosis Date   Anxiety    GERD (gastroesophageal reflux disease)    Thyroid disease    Past Surgical History:  Procedure Laterality Date   ABDOMINAL HYSTERECTOMY     CESAREAN SECTION  08/14/1999   COSMETIC SURGERY     EYE SURGERY     Lasik   Social History   Socioeconomic History   Marital status: Married    Spouse name: Not on file   Number of children: 2   Years of education: Not on file   Highest education level: Not on file  Occupational History   Occupation: Front office OR physical therapist  Tobacco Use   Smoking status: Former    Packs/day: 1.00    Years: 7.00    Total pack  years: 7.00    Types: Cigarettes    Quit date: 04/13/1995    Years since quitting: 26.8   Smokeless tobacco: Never  Vaping Use   Vaping Use: Never used  Substance and Sexual Activity   Alcohol use: Yes    Alcohol/week: 2.0 - 4.0 standard drinks of alcohol    Types: 1 - 2 Glasses of wine, 1 - 2 Shots of liquor per week    Comment: 2-3 drinks of wine or whiskey weekly   Drug use: No   Sexual activity: Yes    Birth control/protection: Post-menopausal, Surgical  Other Topics Concern   Not on file  Social History Narrative   Not on file   Social Determinants of Health   Financial Resource Strain: Not on file  Food Insecurity: Not on file  Transportation Needs: Not on file  Physical Activity: Not on file  Stress: Not on file  Social Connections: Not on file  Intimate Partner Violence: Not on file   Current Outpatient Medications on File Prior to Visit  Medication Sig Dispense Refill   BIOTIN PO Take 1 tablet by mouth daily.     fluticasone (FLONASE) 50 MCG/ACT nasal spray Place 1 spray into both nostrils daily. 16 g 2   levothyroxine (SYNTHROID) 75 MCG tablet Take 75 mcg by mouth 3 (three) times daily.     MAGNESIUM PO Take 1 tablet by mouth at bedtime.     omeprazole (PRILOSEC) 20 MG capsule Take 20 mg by mouth daily.     ondansetron (ZOFRAN-ODT) 4 MG disintegrating tablet Take 4 mg by mouth 3 (three) times daily as needed.     progesterone (PROMETRIUM) 100 MG capsule Take 100 mg by mouth at bedtime.     rosuvastatin (CRESTOR) 20 MG tablet SMARTSIG:1 Tablet(s) By Mouth Every Evening     WEGOVY 0.25 MG/0.5ML SOAJ SMARTSIG:0.25 Milligram(s) SUB-Q Once a Week     zolpidem (AMBIEN) 10 MG tablet Take 10 mg by mouth at bedtime.  3   No current facility-administered medications on file prior to visit.   No Known Allergies Family History  Problem Relation Age of Onset   Thyroid disease Mother    Anxiety disorder Mother    Arthritis Mother    Cancer Mother    Hearing loss Mother     M[place]riages [place]    Arthritis Father    COPD Father    Diabetes Father    Hearing loss Father    Heart disease Father    Hypertension Father    Hyperlipidemia Father    Anxiety disorder Sister    Miscarriages / [place] Sister    Cancer Sister    Miscarriages / [place] Sister  Anxiety disorder Brother    Hearing loss Brother    Hypertension Brother    Hypertension Brother     PE: BP 110/76 (BP Location: Right Arm, Patient Position: Sitting, Cuff Size: Normal)   Pulse 98   Ht 5\' 6"  (1.676 m)   Wt 184 lb 9.6 oz (83.7 kg)   SpO2 97%   BMI 29.80 kg/m  Wt Readings from Last 3 Encounters:  01/26/22 184 lb 9.6 oz (83.7 kg)  06/16/21 200 lb (90.7 kg)  10/18/17 186 lb (84.4 kg)   Constitutional: overweight, in NAD Eyes: EOMI, no exophthalmos ENT: questionable nodule palpated low in neck versus prominent thyroid, no cervical lymphadenopathy Cardiovascular: Tachycardia, RR, No MRG Respiratory: CTA B Musculoskeletal: no deformities Skin: no rashes Neurological: no tremor with outstretched hands  ASSESSMENT: 1. Hypothyroidism -Uncontrolled  2.  Neck fullness  3.  Menopause -At last visit, patient mentioned triglyceridemia.  She was on p.o. estrogen and I recommended to discuss with OB/GYN to change her to transdermal estrogen since this has less effect on triglyceride levels.  She was also on a high dose of progesterone, 300 mg Prometrium daily and I advised her to discuss with OB/GYN to decrease the dose and see if she absolutely needed it especially since she has a history of TAH. -since last OV she came off progesterone and switch to a transdermal estrogen patch  PLAN:  1. Patient with longstanding hypothyroidism, initially on levothyroxine, with persistent hypothyroid symptoms, after which she was switched to Armour therapy per [facility].  She became over-replaced with a very suppressed TSH at her visit with Dr. Collene Mares in 11/2020.  She  was taken off Armour at that time.  She felt much better afterwards.  However, in 12/2020, her TSH was high off thyroid hormones, at 14.  At that time, she was started on levothyroxine 75 mcg daily, which she was still taking at our first visit 7 months ago.  On this dose, TFTs were normal in 05/2021.  However, these were checked when patient was on a high dose of biotin, 10,000 mcg daily.  At last visit, I explained that this would artificially suppress the TSH level and I advised her to come off biotin for at least 2 weeks before rechecking the tests.  Also, at the time of the visit I noticed that she was not taking levothyroxine correctly, taking the methimazole only 1 hour after the thyroid hormone.  I advised her to move omeprazole at least 4 hours later. -At last visit, she appeared euthyroid but was complaining of weight gain.  She was just started on Wegovy.  At today's visit, she feels well, without complaints.  In fact, she feels the best she felt in a long time.  She is titrating Wegovy up, next dose will be maximal dose. - at last visit, we checked her TPO and ATA antibodies and they were not elevated to suggest Hashimoto's thyroiditis. - latest thyroid labs reviewed with pt. >> normal: Lab Results  Component Value Date   TSH 2.62 07/23/2021  - she continues on LT4 75 mcg daily - pt feels good on this dose. - we discussed about taking the thyroid hormone every day, with water, >30 minutes before breakfast, separated by >4 hours from acid reflux medications, calcium, iron, multivitamins. Pt. is taking it correctly now. - will check thyroid tests today: TSH and fT4 - If labs are abnormal, she will need to return for repeat TFTs in 1.5 months - I will see  her back in 1 year  2. Neck mass -At last visit, on palpation of her neck, I feel the questionable nodule versus prominent thyroid.  -However, this is not palpable at today's visit.  Also, no lymphadenopathy palpated. -No neck compression  symptoms -We will continue to follow her expectantly  Needs refills.   Component     Latest Ref Rng 01/26/2022  T4,Free(Direct)     0.60 - 1.60 ng/dL 0.84   TSH     0.35 - 5.50 uIU/mL 3.55    Thyroid tests are normal, but TSH is high in the normal range.  Since she is still losing weight from [facility], I will suggest to check another TSH in approximately 4 to 6 months and see if she needs an increase in the levothyroxine dose at that time.  Philemon Kingdom, MD PhD [facility] Endocrinology

## 2022-01-26 NOTE — Patient Instructions (Addendum)
Please continue Levothyroxine 75 mcg.  Take this  every day, with water, at least 30 minutes before breakfast, separated by at least 4 hours from: - acid reflux medications - calcium - iron - multivitamins  Please return in 1 year.

## 2022-04-15 DIAGNOSIS — Z1329 Encounter for screening for other suspected endocrine disorder: Secondary | ICD-10-CM | POA: Diagnosis not present

## 2022-04-15 DIAGNOSIS — Z131 Encounter for screening for diabetes mellitus: Secondary | ICD-10-CM | POA: Diagnosis not present

## 2022-04-15 DIAGNOSIS — Z0001 Encounter for general adult medical examination with abnormal findings: Secondary | ICD-10-CM | POA: Diagnosis not present

## 2022-04-15 DIAGNOSIS — E785 Hyperlipidemia, unspecified: Secondary | ICD-10-CM | POA: Diagnosis not present

## 2022-04-22 DIAGNOSIS — K5900 Constipation, unspecified: Secondary | ICD-10-CM | POA: Diagnosis not present

## 2022-04-22 DIAGNOSIS — E781 Pure hyperglyceridemia: Secondary | ICD-10-CM | POA: Diagnosis not present

## 2022-04-22 DIAGNOSIS — E668 Other obesity: Secondary | ICD-10-CM | POA: Diagnosis not present

## 2022-04-22 DIAGNOSIS — R110 Nausea: Secondary | ICD-10-CM | POA: Diagnosis not present

## 2022-04-22 DIAGNOSIS — E039 Hypothyroidism, unspecified: Secondary | ICD-10-CM | POA: Diagnosis not present

## 2022-07-29 DIAGNOSIS — D485 Neoplasm of uncertain behavior of skin: Secondary | ICD-10-CM | POA: Diagnosis not present

## 2022-07-29 DIAGNOSIS — L821 Other seborrheic keratosis: Secondary | ICD-10-CM | POA: Diagnosis not present

## 2022-07-29 DIAGNOSIS — L814 Other melanin hyperpigmentation: Secondary | ICD-10-CM | POA: Diagnosis not present

## 2022-07-29 DIAGNOSIS — L578 Other skin changes due to chronic exposure to nonionizing radiation: Secondary | ICD-10-CM | POA: Diagnosis not present

## 2022-07-29 DIAGNOSIS — L851 Acquired keratosis [keratoderma] palmaris et plantaris: Secondary | ICD-10-CM | POA: Diagnosis not present

## 2022-10-18 DIAGNOSIS — E668 Other obesity: Secondary | ICD-10-CM | POA: Diagnosis not present

## 2022-10-18 DIAGNOSIS — F419 Anxiety disorder, unspecified: Secondary | ICD-10-CM | POA: Diagnosis not present

## 2022-10-18 DIAGNOSIS — Z0001 Encounter for general adult medical examination with abnormal findings: Secondary | ICD-10-CM | POA: Diagnosis not present

## 2022-10-18 DIAGNOSIS — Z23 Encounter for immunization: Secondary | ICD-10-CM | POA: Diagnosis not present

## 2022-10-18 DIAGNOSIS — Z1211 Encounter for screening for malignant neoplasm of colon: Secondary | ICD-10-CM | POA: Diagnosis not present

## 2022-10-18 DIAGNOSIS — E785 Hyperlipidemia, unspecified: Secondary | ICD-10-CM | POA: Diagnosis not present

## 2022-10-18 DIAGNOSIS — F331 Major depressive disorder, recurrent, moderate: Secondary | ICD-10-CM | POA: Diagnosis not present

## 2022-10-18 DIAGNOSIS — Z6831 Body mass index (BMI) 31.0-31.9, adult: Secondary | ICD-10-CM | POA: Diagnosis not present

## 2022-11-01 DIAGNOSIS — Z01419 Encounter for gynecological examination (general) (routine) without abnormal findings: Secondary | ICD-10-CM | POA: Diagnosis not present

## 2022-11-01 DIAGNOSIS — Z6828 Body mass index (BMI) 28.0-28.9, adult: Secondary | ICD-10-CM | POA: Diagnosis not present

## 2022-11-01 DIAGNOSIS — Z1231 Encounter for screening mammogram for malignant neoplasm of breast: Secondary | ICD-10-CM | POA: Diagnosis not present

## 2022-11-11 DIAGNOSIS — Z0001 Encounter for general adult medical examination with abnormal findings: Secondary | ICD-10-CM | POA: Diagnosis not present

## 2022-11-11 DIAGNOSIS — E785 Hyperlipidemia, unspecified: Secondary | ICD-10-CM | POA: Diagnosis not present

## 2022-11-11 DIAGNOSIS — R002 Palpitations: Secondary | ICD-10-CM | POA: Diagnosis not present

## 2022-11-11 DIAGNOSIS — E559 Vitamin D deficiency, unspecified: Secondary | ICD-10-CM | POA: Diagnosis not present

## 2023-01-27 ENCOUNTER — Encounter: Payer: Self-pay | Admitting: Internal Medicine

## 2023-01-27 ENCOUNTER — Ambulatory Visit: Payer: BC Managed Care – PPO | Admitting: Internal Medicine

## 2023-01-27 VITALS — BP 110/70 | HR 90 | Ht 66.0 in | Wt 178.2 lb

## 2023-01-27 DIAGNOSIS — R221 Localized swelling, mass and lump, neck: Secondary | ICD-10-CM | POA: Diagnosis not present

## 2023-01-27 DIAGNOSIS — E039 Hypothyroidism, unspecified: Secondary | ICD-10-CM

## 2023-01-27 LAB — T4, FREE: Free T4: 0.97 ng/dL (ref 0.60–1.60)

## 2023-01-27 LAB — TSH: TSH: 2.19 u[IU]/mL (ref 0.35–5.50)

## 2023-01-27 MED ORDER — LEVOTHYROXINE SODIUM 75 MCG PO TABS: 75.0000 ug | ORAL_TABLET | Freq: Every day | ORAL | 3 refills | Status: DC

## 2023-01-27 NOTE — Patient Instructions (Signed)
Please continue Levothyroxine 75 mcg.  Take this  every day, with water, at least 30 minutes before breakfast, separated by at least 4 hours from: - acid reflux medications - calcium - iron - multivitamins  Please stop at the lab.  Please return in 1 year.

## 2023-01-27 NOTE — Progress Notes (Signed)
Patient ID: Karen Haney, female   DOB: 10/27/1970, 52 y.o.   MRN: [id number]  HPI  Karen Haney is a 52 y.o.-year-old female, initially referred by her PCP, Dr.Bakare, returning for follow-up for acquired hypothyroidism.  Last visit 1 year ago.  Interim history: Patient has no complaints at this visit.  At last visit, after she started [facility], she lost 16 pounds and was feeling the best she felt in 20 years. She lost 6 more lbs since then. She feels that her quality has improved, anxiety also improved.   Reviewed history: Patient describes that she was diagnosed with hypothyroidism "years ago" and was started on levothyroxine.  However, she persisted to have fatigue/exhaustion, hair loss, nail changes, brain fog, vertigo, purple discoloration of fingers despite normal TFTs.   She ended up seeing  [facility] >> she was changed to Armour Thyroid 90 mg daily.  She initially felt well on this, but then she started to experience palpitations.  She saw cardiology - ruled out a cardiac problem.  She saw Dr. Loreta Ave in 11/2020 for constipation, bloating, and a TSH was very low.  She was advised to stop Armour.  She did so.  She felt much better after stopping this.  She saw Dr. Corky Downs in 12/2020 and at that time a TSH was 14 >> started  LT4 50 mcg daily >> then increased to 75 mcg daily.   TFTs checked by Dr. Corky Downs after the above change and these were normal.  She takes LT4 75 mcg daily: - fasting - with water - separated by >30 min from b'fast  - no calcium, iron - + PPIs (Omeprazole) 1h later >> moved >4h later - no multivitamins   I reviewed pt's thyroid tests: 11/11/2022: TSH 5.66 (0.40-4.5) - missed 2-3 doses. Lab Results  Component Value Date   TSH 3.55 01/26/2022   TSH 2.62 07/23/2021   FREET4 0.84 01/26/2022   FREET4 0.86 07/23/2021   T3FREE 2.7 07/23/2021  05/26/2021: TSH 3.23, TT4 9.8  12/25/2020: TSH 14.01 11/25/2020: TSH 0.021  Antithyroid  antibodies: Component     Latest Ref Rng 07/23/2021  Thyroperoxidase Ab SerPl-aCnc     <9 IU/mL 1   Thyroglobulin Ab     < or = 1 IU/mL <1    At our first visit, she described: - weight gain, increased appetite -started on Wegovy 3 weeks prior to the visit - fatigue - no heat or cold intolerance - poor sleep  - no depression, but + anxiety - constipation - dry skin - hair loss - mm aches Afterwards, she mentioned constipation - had to come off Trulance 2/2 price.   No complaints at today's visit.  Pt denies feeling nodules in neck, dysphagia/odynophagia She has hoarseness and lower dysphagia - has GERD and a small hiatal hernia per Barium swallow test from 2012. On Omeprazole.  She has + FH of thyroid disorders in: mother and sister. No FH of thyroid cancer.  No h/o radiation tx to head or neck. No recent use of iodine supplements.  Pt. also has a history of palpitations, obesity, GERD-on omeprazole, constipation, depression/anxiety. She was on hormone replacement therapy with estradiol 2 mg p.o. daily and Prometrium 300 mg nightly (!) - s/p TAH in her 28s. Now on t-d estradiol. She describes triglycerides in the 700s, which improved to upper 200s - on Rosuvastatin.  She is getting Botox injections by plastic surgery at [facility].  She exercises 3-5 times a week: Pilates, swimming, walking,  vibration plate, yoga.  ROS: + See HPI  Past Medical History:  Diagnosis Date   Anxiety    GERD (gastroesophageal reflux disease)    Thyroid disease    Past Surgical History:  Procedure Laterality Date   ABDOMINAL HYSTERECTOMY     CESAREAN SECTION  08/14/1999   COSMETIC SURGERY     EYE SURGERY     Lasik   Social History   Socioeconomic History   Marital status: Married    Spouse name: Not on file   Number of children: 2   Years of education: Not on file   Highest education level: Not on file  Occupational History   Occupation: Front office OR physical therapist  Tobacco  Use   Smoking status: Former    Current packs/day: 0.00    Average packs/day: 1 pack/day for 7.0 years (7.0 ttl pk-yrs)    Types: Cigarettes    Start date: 04/12/1988    Quit date: 04/13/1995    Years since quitting: 27.8   Smokeless tobacco: Never  Vaping Use   Vaping status: Never Used  Substance and Sexual Activity   Alcohol use: Yes    Alcohol/week: 2.0 - 4.0 standard drinks of alcohol    Types: 1 - 2 Glasses of wine, 1 - 2 Shots of liquor per week    Comment: 2-3 drinks of wine or whiskey weekly   Drug use: No   Sexual activity: Yes    Birth control/protection: Post-menopausal, Surgical  Other Topics Concern   Not on file  Social History Narrative   Not on file   Social Determinants of Health   Financial Resource Strain: Not on file  Food Insecurity: Not on file  Transportation Needs: Not on file  Physical Activity: Not on file  Stress: Not on file  Social Connections: Not on file  Intimate Partner Violence: Not on file   Current Outpatient Medications on File Prior to Visit  Medication Sig Dispense Refill   fluticasone (FLONASE) 50 MCG/ACT nasal spray Place 1 spray into both nostrils daily. 16 g 2   levothyroxine (SYNTHROID) 75 MCG tablet Take 75 mcg by mouth 3 (three) times daily.     MAGNESIUM PO Take 1 tablet by mouth at bedtime.     omeprazole (PRILOSEC) 20 MG capsule Take 20 mg by mouth daily.     ondansetron (ZOFRAN-ODT) 4 MG disintegrating tablet Take 4 mg by mouth 3 (three) times daily as needed.     rosuvastatin (CRESTOR) 20 MG tablet SMARTSIG:1 Tablet(s) By Mouth Every Evening     WEGOVY 0.25 MG/0.5ML SOAJ SMARTSIG:0.25 Milligram(s) SUB-Q Once a Week     zolpidem (AMBIEN) 10 MG tablet Take 10 mg by mouth at bedtime.  3   No current facility-administered medications on file prior to visit.   No Known Allergies Family History  Problem Relation Age of Onset   Thyroid disease Mother    Anxiety disorder Mother    Arthritis Mother    Cancer Mother     Hearing loss Mother    Miscarriages / [place] Mother    Arthritis Father    COPD Father    Diabetes Father    Hearing loss Father    Heart disease Father    Hypertension Father    Hyperlipidemia Father    Anxiety disorder Sister    Miscarriages / [place] Sister    Cancer Sister    Miscarriages / Stillbirths Sister    Anxiety disorder Brother    Hearing loss Brother  Hypertension Brother    Hypertension Brother     PE: BP 110/70   Pulse 90   Ht 5\' 6"  (1.676 m)   Wt 178 lb 3.2 oz (80.8 kg)   SpO2 99%   BMI 28.76 kg/m  Wt Readings from Last 3 Encounters:  01/27/23 178 lb 3.2 oz (80.8 kg)  01/26/22 184 lb 9.6 oz (83.7 kg)  06/16/21 200 lb (90.7 kg)   Constitutional: overweight, in NAD Eyes: EOMI, no exophthalmos ENT: No neck masses palpable, no cervical lymphadenopathy Cardiovascular: RRR, No MRG Respiratory: CTA B Musculoskeletal: no deformities Skin: no rashes Neurological: no tremor with outstretched hands  ASSESSMENT: 1. Hypothyroidism - Prev. Uncontrolled  2.  Neck mass  3.  Menopause -pt has a history of triglyceridemia.  She was on p.o. estrogen and I recommended to discuss with OB/GYN to change her to transdermal estrogen since this has less effect on triglyceride levels.  She was also on a high dose of progesterone, 300 mg Prometrium daily and I advised her to discuss with OB/GYN to decrease the dose and see if she absolutely needed it especially since she has a history of TAH. -Afterwards, she came off progesterone and switch to a transdermal estrogen patch  PLAN:  1. Patient with longstanding hypothyroidism, initially on levothyroxine, with persistent hypothyroid symptoms, after which she was switched to Armour therapy per [facility].  She became over-replaced with a very suppressed TSH at her visit with Dr. Loreta Ave in 11/2020.  She was taken off Armour at that time.  She felt much better afterwards.  However, in 12/2020, her TSH was high  off thyroid hormones, at 14.  At that time, she was started on levothyroxine 75 mcg daily, which she was still taking at our first visit 7 months ago.  On this dose, TFTs were normal in 05/2021.  However, these were checked when patient was on a high dose of biotin, 10,000 mcg daily.  I explained that this would artificially suppress the TSH level and I advised her to come off biotin for at least 2 weeks before rechecking the tests.  Also, at the time of the visit I noticed that she was not taking levothyroxine correctly, taking the omeprazole only 1 hour after the thyroid hormone.  We moved the PPI at least 4 hours later. -TPO and ATA antibodies were not elevated to point towards Hashimoto's thyroiditis - latest thyroid labs reviewed with pt. >> TSH was slightly elevated when obtained 2 months ago.  However, she mentions that she missed 2-3 doses before this measurement. - she continues on LT4 75 mcg daily - at last visit, she started [facility] and already lost 16 pounds.  We discussed that with more weight loss, we may need to adjust her LT4 dose.  Since then, she lost another 6 pounds. - pt feels good on this dose. - we discussed about taking the thyroid hormone every day, with water, >30 minutes before breakfast, separated by >4 hours from acid reflux medications, calcium, iron, multivitamins. Pt. is taking it correctly. - will check thyroid tests today: TSH and fT4 - If labs are abnormal, she will need to return for repeat TFTs in 1.5 months - OTW, I will see her back in a year  2. Neck mass -2 years ago, on palpation of her neck, I felt a questionable nodule versus prominent thyroid -However, this is not palpable anymore and also no lymphadenopathy palpated today. -No neck compression symptoms -Will continue to follow her expectantly  Needs refills.  Component     Latest Ref Rng 01/27/2023  T4,Free(Direct)     0.60 - 1.60 ng/dL 1.61   TSH     0.96 - 0.45 uIU/mL 2.19   TFTs are normal.   Will continue the same regimen.  Carlus Pavlov, MD PhD [facility] Endocrinology

## 2023-01-28 DIAGNOSIS — M25551 Pain in right hip: Secondary | ICD-10-CM | POA: Diagnosis not present

## 2023-02-14 DIAGNOSIS — E785 Hyperlipidemia, unspecified: Secondary | ICD-10-CM | POA: Diagnosis not present

## 2023-02-14 DIAGNOSIS — K219 Gastro-esophageal reflux disease without esophagitis: Secondary | ICD-10-CM | POA: Diagnosis not present

## 2023-02-14 DIAGNOSIS — Z6831 Body mass index (BMI) 31.0-31.9, adult: Secondary | ICD-10-CM | POA: Diagnosis not present

## 2023-02-14 DIAGNOSIS — E6609 Other obesity due to excess calories: Secondary | ICD-10-CM | POA: Diagnosis not present

## 2023-05-11 DIAGNOSIS — L723 Sebaceous cyst: Secondary | ICD-10-CM | POA: Diagnosis not present

## 2023-05-16 DIAGNOSIS — E785 Hyperlipidemia, unspecified: Secondary | ICD-10-CM | POA: Diagnosis not present

## 2023-05-16 DIAGNOSIS — E66811 Obesity, class 1: Secondary | ICD-10-CM | POA: Diagnosis not present

## 2023-05-16 DIAGNOSIS — Z6831 Body mass index (BMI) 31.0-31.9, adult: Secondary | ICD-10-CM | POA: Diagnosis not present

## 2023-05-16 DIAGNOSIS — E039 Hypothyroidism, unspecified: Secondary | ICD-10-CM | POA: Diagnosis not present

## 2023-06-23 DIAGNOSIS — L929 Granulomatous disorder of the skin and subcutaneous tissue, unspecified: Secondary | ICD-10-CM | POA: Diagnosis not present

## 2023-06-23 DIAGNOSIS — L089 Local infection of the skin and subcutaneous tissue, unspecified: Secondary | ICD-10-CM | POA: Diagnosis not present

## 2023-06-23 DIAGNOSIS — L723 Sebaceous cyst: Secondary | ICD-10-CM | POA: Diagnosis not present

## 2023-06-28 ENCOUNTER — Encounter: Payer: Self-pay | Admitting: Internal Medicine

## 2023-06-29 DIAGNOSIS — N951 Menopausal and female climacteric states: Secondary | ICD-10-CM | POA: Diagnosis not present

## 2023-06-29 DIAGNOSIS — Z1329 Encounter for screening for other suspected endocrine disorder: Secondary | ICD-10-CM | POA: Diagnosis not present

## 2023-07-18 DIAGNOSIS — R221 Localized swelling, mass and lump, neck: Secondary | ICD-10-CM | POA: Diagnosis not present

## 2023-07-18 DIAGNOSIS — E039 Hypothyroidism, unspecified: Secondary | ICD-10-CM | POA: Diagnosis not present

## 2023-07-18 DIAGNOSIS — Z6831 Body mass index (BMI) 31.0-31.9, adult: Secondary | ICD-10-CM | POA: Diagnosis not present

## 2023-07-18 DIAGNOSIS — E66811 Obesity, class 1: Secondary | ICD-10-CM | POA: Diagnosis not present

## 2023-07-21 DIAGNOSIS — Z6827 Body mass index (BMI) 27.0-27.9, adult: Secondary | ICD-10-CM | POA: Diagnosis not present

## 2023-07-21 DIAGNOSIS — N898 Other specified noninflammatory disorders of vagina: Secondary | ICD-10-CM | POA: Diagnosis not present

## 2023-07-21 DIAGNOSIS — R232 Flushing: Secondary | ICD-10-CM | POA: Diagnosis not present

## 2023-07-21 DIAGNOSIS — N951 Menopausal and female climacteric states: Secondary | ICD-10-CM | POA: Diagnosis not present

## 2023-07-25 ENCOUNTER — Other Ambulatory Visit: Payer: Self-pay | Admitting: Internal Medicine

## 2023-07-25 ENCOUNTER — Encounter: Payer: Self-pay | Admitting: Internal Medicine

## 2023-07-25 DIAGNOSIS — R221 Localized swelling, mass and lump, neck: Secondary | ICD-10-CM

## 2023-08-05 ENCOUNTER — Ambulatory Visit
Admission: RE | Admit: 2023-08-05 | Discharge: 2023-08-05 | Disposition: A | Discharge status: Home / Self Care | Source: Ambulatory Visit | Attending: Internal Medicine | Admitting: Internal Medicine

## 2023-08-05 DIAGNOSIS — R221 Localized swelling, mass and lump, neck: Secondary | ICD-10-CM

## 2023-09-27 DIAGNOSIS — E785 Hyperlipidemia, unspecified: Secondary | ICD-10-CM | POA: Diagnosis not present

## 2023-09-27 DIAGNOSIS — Z0001 Encounter for general adult medical examination with abnormal findings: Secondary | ICD-10-CM | POA: Diagnosis not present

## 2023-09-27 DIAGNOSIS — E559 Vitamin D deficiency, unspecified: Secondary | ICD-10-CM | POA: Diagnosis not present

## 2023-09-27 DIAGNOSIS — Z131 Encounter for screening for diabetes mellitus: Secondary | ICD-10-CM | POA: Diagnosis not present

## 2023-10-06 DIAGNOSIS — N951 Menopausal and female climacteric states: Secondary | ICD-10-CM | POA: Diagnosis not present

## 2023-10-10 DIAGNOSIS — N951 Menopausal and female climacteric states: Secondary | ICD-10-CM | POA: Diagnosis not present

## 2023-10-11 DIAGNOSIS — L821 Other seborrheic keratosis: Secondary | ICD-10-CM | POA: Diagnosis not present

## 2023-10-11 DIAGNOSIS — L814 Other melanin hyperpigmentation: Secondary | ICD-10-CM | POA: Diagnosis not present

## 2023-10-11 DIAGNOSIS — L578 Other skin changes due to chronic exposure to nonionizing radiation: Secondary | ICD-10-CM | POA: Diagnosis not present

## 2023-10-11 DIAGNOSIS — D485 Neoplasm of uncertain behavior of skin: Secondary | ICD-10-CM | POA: Diagnosis not present

## 2023-10-11 DIAGNOSIS — L851 Acquired keratosis [keratoderma] palmaris et plantaris: Secondary | ICD-10-CM | POA: Diagnosis not present

## 2023-11-08 DIAGNOSIS — Z01419 Encounter for gynecological examination (general) (routine) without abnormal findings: Secondary | ICD-10-CM | POA: Diagnosis not present

## 2023-11-08 DIAGNOSIS — Z1231 Encounter for screening mammogram for malignant neoplasm of breast: Secondary | ICD-10-CM | POA: Diagnosis not present

## 2023-11-08 DIAGNOSIS — Z6827 Body mass index (BMI) 27.0-27.9, adult: Secondary | ICD-10-CM | POA: Diagnosis not present

## 2023-11-08 DIAGNOSIS — Z13820 Encounter for screening for osteoporosis: Secondary | ICD-10-CM | POA: Diagnosis not present

## 2023-11-14 DIAGNOSIS — Z01419 Encounter for gynecological examination (general) (routine) without abnormal findings: Secondary | ICD-10-CM | POA: Diagnosis not present

## 2023-11-14 DIAGNOSIS — R8290 Unspecified abnormal findings in urine: Secondary | ICD-10-CM | POA: Diagnosis not present

## 2023-11-14 DIAGNOSIS — Z23 Encounter for immunization: Secondary | ICD-10-CM | POA: Diagnosis not present

## 2023-11-14 DIAGNOSIS — F331 Major depressive disorder, recurrent, moderate: Secondary | ICD-10-CM | POA: Diagnosis not present

## 2023-11-14 DIAGNOSIS — E6609 Other obesity due to excess calories: Secondary | ICD-10-CM | POA: Diagnosis not present

## 2023-11-14 DIAGNOSIS — Z1211 Encounter for screening for malignant neoplasm of colon: Secondary | ICD-10-CM | POA: Diagnosis not present

## 2023-11-14 DIAGNOSIS — Z0001 Encounter for general adult medical examination with abnormal findings: Secondary | ICD-10-CM | POA: Diagnosis not present

## 2023-11-14 DIAGNOSIS — E66811 Obesity, class 1: Secondary | ICD-10-CM | POA: Diagnosis not present

## 2023-11-14 DIAGNOSIS — E785 Hyperlipidemia, unspecified: Secondary | ICD-10-CM | POA: Diagnosis not present

## 2023-11-14 DIAGNOSIS — Z6831 Body mass index (BMI) 31.0-31.9, adult: Secondary | ICD-10-CM | POA: Diagnosis not present

## 2024-01-27 ENCOUNTER — Other Ambulatory Visit

## 2024-01-27 ENCOUNTER — Ambulatory Visit (INDEPENDENT_AMBULATORY_CARE_PROVIDER_SITE_OTHER): Payer: BC Managed Care – PPO | Admitting: Internal Medicine

## 2024-01-27 ENCOUNTER — Encounter: Payer: Self-pay | Admitting: Internal Medicine

## 2024-01-27 VITALS — BP 120/70 | HR 93 | Ht 66.0 in | Wt 163.4 lb

## 2024-01-27 DIAGNOSIS — R221 Localized swelling, mass and lump, neck: Secondary | ICD-10-CM | POA: Diagnosis not present

## 2024-01-27 DIAGNOSIS — E039 Hypothyroidism, unspecified: Secondary | ICD-10-CM

## 2024-01-27 NOTE — Progress Notes (Signed)
 Patient ID: Karen Haney, female   DOB: 02-Jun-1970, 53 y.o.   MRN: [id number]  HPI  Karen Haney is a 53 y.o.-year-old female, initially referred by her PCP, Dr. Roanna, returning for follow-up for acquired hypothyroidism.  Last visit 1 year ago.  Interim history: Patient has no complaints at this visit.  After she started [place] 2 years ago, she lost 22 pounds and she felt the best she felt in 20 years. Since last OV, she lost another 15 lbs!  Continues to exercise consistently. She had right submandibular swelling since last visit, but the thyroid  ultrasound obtained 08/05/2023 did not show any abnormality.  Reviewed history: Patient describes that she was diagnosed with hypothyroidism years ago and was started on levothyroxine .  However, she persisted to have fatigue/exhaustion, hair loss, nail changes, brain fog, vertigo, purple discoloration of fingers despite normal TFTs.   She ended up seeing  [facility] >> she was changed to Armour Thyroid  90 mg daily.  She initially felt well on this, but then she started to experience palpitations.  She saw cardiology - ruled out a cardiac problem.  She saw Dr. Kristie in 11/2020 for constipation, bloating, and a TSH was very low.  She was advised to stop Armour.  She did so.  She felt much better after stopping this.  She saw Dr. Roanna in 12/2020 and at that time a TSH was 14 >> started  LT4 50 mcg daily >> then increased to 75 mcg daily.   TFTs checked by Dr. Roanna after the above change and these were normal.  She takes LT4 75 mcg daily: - fasting - with water - separated by >30 min from b'fast  - no calcium, iron - + PPIs (Omeprazole) 1h later >> moved >4h later - no multivitamins   I reviewed pt's thyroid  tests: Lab Results  Component Value Date   TSH 2.19 01/27/2023   TSH 3.55 01/26/2022   TSH 2.62 07/23/2021   FREET4 0.97 01/27/2023   FREET4 0.84 01/26/2022   FREET4 0.86 07/23/2021   T3FREE 2.7  07/23/2021  11/11/2022: TSH 5.66 (0.40-4.5) - missed 2-3 doses. 05/26/2021: TSH 3.23, TT4 9.8  12/25/2020: TSH 14.01 11/25/2020: TSH 0.021  Antithyroid antibodies: Component     Latest Ref Rng 07/23/2021  Thyroperoxidase Ab SerPl-aCnc     <9 IU/mL 1   Thyroglobulin Ab     < or = 1 IU/mL <1    At our first visit, she described: - weight gain, increased appetite -but then started on Wegovy - fatigue - no heat or cold intolerance - poor sleep  - no depression, but + anxiety - constipation - dry skin - hair loss - mm aches Afterwards, she mentioned constipation - had to come off Trulance 2/2 price.    Pt denies feeling nodules in neck, dysphagia/odynophagia She has a history of hoarseness and lower dysphagia - has GERD and a small hiatal hernia per Barium swallow test from 2012. On Omeprazole.  Thyroid  U/S (08/05/2023): Parenchymal Echotexture: Mildly heterogenous  Isthmus: 0.3 cm  Right lobe: 3.5 x 0.7 x 0.8 cm  Left lobe: 3.4 x 0.7 x 0.7 cm  _________________________________________________________   Estimated total number of nodules >/= 1 cm: 0 _________________________________________________________   No discrete nodules are seen within the thyroid  gland.   IMPRESSION: 1. No significant sonographic abnormality of the thyroid . 2. Targeted sonographic evaluation of the right submandibular region demonstrates normal nonenlarged lymph nodes. No significant abnormality of the right submandibular gland is  identified.  She has + FH of thyroid  disorders in: mother and sister. No FH of thyroid  cancer.  No h/o radiation tx to head or neck. No recent use of iodine supplements.  Pt. also has a history of palpitations, obesity, GERD-on omeprazole, constipation, depression/anxiety. She was on hormone replacement therapy with estradiol  2 mg p.o. daily and Prometrium  300 mg nightly (!) - s/p TAH in her 77s >> then started t-d estradiol . She describes triglycerides in the 700s, which  improved to upper 200s - on Rosuvastatin. Now in the 100s.  She exercises 3-5 times a week: Pilates, swimming, walking, vibration plate, yoga.  ROS: + See HPI  Past Medical History:  Diagnosis Date   Anxiety    GERD (gastroesophageal reflux disease)    Thyroid  disease    Past Surgical History:  Procedure Laterality Date   ABDOMINAL HYSTERECTOMY     CESAREAN SECTION  08/14/1999   COSMETIC SURGERY     EYE SURGERY     Lasik   Social History   Socioeconomic History   Marital status: Married    Spouse name: Not on file   Number of children: 2   Years of education: Not on file   Highest education level: Not on file  Occupational History   Occupation: Front office OR physical therapist  Tobacco Use   Smoking status: Former    Current packs/day: 0.00    Average packs/day: 1 pack/day for 7.0 years (7.0 ttl pk-yrs)    Types: Cigarettes    Start date: 04/12/1988    Quit date: 04/13/1995    Years since quitting: 28.8   Smokeless tobacco: Never  Vaping Use   Vaping status: Never Used  Substance and Sexual Activity   Alcohol use: Yes    Alcohol/week: 2.0 - 4.0 standard drinks of alcohol    Types: 1 - 2 Glasses of wine, 1 - 2 Shots of liquor per week    Comment: 2-3 drinks of wine or whiskey weekly   Drug use: No   Sexual activity: Yes    Birth control/protection: Post-menopausal, Surgical  Other Topics Concern   Not on file  Social History Narrative   Not on file   Social Drivers of Health   Financial Resource Strain: Not on file  Food Insecurity: Not on file  Transportation Needs: Not on file  Physical Activity: Not on file  Stress: Not on file  Social Connections: Not on file  Intimate Partner Violence: Not on file   Current Outpatient Medications on File Prior to Visit  Medication Sig Dispense Refill   fluticasone  (FLONASE ) 50 MCG/ACT nasal spray Place 1 spray into both nostrils daily. 16 g 2   levothyroxine  (SYNTHROID ) 75 MCG tablet Take 1 tablet (75 mcg total)  by mouth daily before breakfast. 90 tablet 3   MAGNESIUM PO Take 1 tablet by mouth at bedtime.     omeprazole (PRILOSEC) 20 MG capsule Take 20 mg by mouth daily.     ondansetron  (ZOFRAN -ODT) 4 MG disintegrating tablet Take 4 mg by mouth 3 (three) times daily as needed.     rosuvastatin (CRESTOR) 20 MG tablet SMARTSIG:1 Tablet(s) By Mouth Every Evening     WEGOVY 0.25 MG/0.5ML SOAJ SMARTSIG:0.25 Milligram(s) SUB-Q Once a Week     zolpidem (AMBIEN) 10 MG tablet Take 10 mg by mouth at bedtime.  3   No current facility-administered medications on file prior to visit.   No Known Allergies Family History  Problem Relation Age of Onset  Thyroid  disease Mother    Anxiety disorder Mother    Arthritis Mother    Cancer Mother    Hearing loss Mother    Miscarriages / [place] Mother    Arthritis Father    COPD Father    Diabetes Father    Hearing loss Father    Heart disease Father    Hypertension Father    Hyperlipidemia Father    Anxiety disorder Sister    Miscarriages / [place] Sister    Cancer Sister    Miscarriages / Stillbirths Sister    Anxiety disorder Brother    Hearing loss Brother    Hypertension Brother    Hypertension Brother    PE: BP 120/70   Pulse 93   Ht 5' 6 (1.676 m)   Wt 163 lb 6.4 oz (74.1 kg)   SpO2 99%   BMI 26.37 kg/m  Wt Readings from Last 20 Encounters:  01/27/24 163 lb 6.4 oz (74.1 kg)  01/27/23 178 lb 3.2 oz (80.8 kg)  01/26/22 184 lb 9.6 oz (83.7 kg)  06/16/21 200 lb (90.7 kg)  10/18/17 186 lb (84.4 kg)  04/02/16 185 lb (83.9 kg)  03/09/16 190 lb (86.2 kg)  07/17/08 166 lb (75.3 kg)   Constitutional: overweight, in NAD Eyes: EOMI, no exophthalmos ENT: No neck masses palpable, no cervical lymphadenopathy Cardiovascular: tachycardia, RR, No MRG Respiratory: CTA B Musculoskeletal: no deformities Skin: no rashes Neurological: no tremor with outstretched hands  ASSESSMENT: 1. Hypothyroidism - Prev. Uncontrolled  2.  History of  neck mass/neck fullness  PLAN:  1. Patient with longstanding hypothyroidism, initially on levothyroxine , with persistent thyroid  symptoms, after which she was switched to Armour therapy by integrative medicine. She became over-replaced with a very suppressed TSH at her visit with Dr. Kristie in 11/2020.  She was taken off Armour at that time.  She felt much better afterwards.  However, her TSH increased and she was started on levothyroxine .   -TPO and ATA antibodies were not elevated to point towards Hashimoto's thyroiditis - latest thyroid  labs reviewed with pt. >> normal: Lab Results  Component Value Date   TSH 2.19 01/27/2023  - she continues on LT4 75 mcg daily - pt feels good on this dose.  Before the last 2 visits, on Wegovy, she lost 22 pounds.  She lost another 50 pounds since last visit.  She is tachycardic at today's visit, which is not new for her.  We discussed about trying to stay well-hydrated. - we discussed about taking the thyroid  hormone every day, with water, >30 minutes before breakfast, separated by >4 hours from acid reflux medications, calcium, iron, multivitamins. Pt. is taking it correctly. - will check thyroid  tests today: TSH and fT4 - If labs are abnormal, she will need to return for repeat TFTs in 1.5 months - OTW, I will see her back in 1 year  2.  History of neck mass/neck fullness - 3 years ago, on palpation of her neck, I felt a questionable nodule versus prominent thyroid , however, this resolved and it is not palpable anymore - She contacted us  in 06/2023 for a lump in her throat.  We advised her to schedule a visit either with us  or with PCP to see what further evaluation she needed.  She was able to see her PCP and it turns out that she had right submandibular swelling. A thyroid  ultrasound from 08/05/2023 showed no thyroid  abnormality, no lymphadenopathy, and no corresponding mass. - No neck compression symptoms at today's  visit - Will continue to follow her  expectantly  Needs refills.  Orders Placed This Encounter  Procedures   TSH   T4, free   Lela Fendt, MD PhD [facility]

## 2024-01-27 NOTE — Patient Instructions (Signed)
Please continue Levothyroxine 75 mcg.  Take this  every day, with water, at least 30 minutes before breakfast, separated by at least 4 hours from: - acid reflux medications - calcium - iron - multivitamins  Please stop at the lab.  Please return in 1 year.

## 2024-01-28 LAB — T4, FREE: Free T4: 1.5 ng/dL (ref 0.8–1.8)

## 2024-01-28 LAB — TSH: TSH: 1.25 m[IU]/L

## 2024-01-30 ENCOUNTER — Ambulatory Visit: Payer: Self-pay | Admitting: Internal Medicine

## 2024-01-30 MED ORDER — LEVOTHYROXINE SODIUM 75 MCG PO TABS: 75.0000 ug | ORAL_TABLET | Freq: Every day | ORAL | 3 refills | Status: AC

## 2024-01-30 NOTE — Addendum Note (Signed)
 Addended by: TRIXIE FILE on: 01/30/2024 08:55 AM   Modules accepted: Orders

## 2024-02-27 DIAGNOSIS — R110 Nausea: Secondary | ICD-10-CM | POA: Diagnosis not present

## 2024-02-27 DIAGNOSIS — E66811 Obesity, class 1: Secondary | ICD-10-CM | POA: Diagnosis not present

## 2024-02-27 DIAGNOSIS — Z6831 Body mass index (BMI) 31.0-31.9, adult: Secondary | ICD-10-CM | POA: Diagnosis not present

## 2024-02-27 DIAGNOSIS — E6609 Other obesity due to excess calories: Secondary | ICD-10-CM | POA: Diagnosis not present

## 2025-01-28 ENCOUNTER — Ambulatory Visit: Admitting: Internal Medicine
